# Patient Record
Sex: Female | Born: 1935 | Race: White | Hispanic: No | Smoking: Never smoker
Health system: Southern US, Community
[De-identification: ages and names within clinical notes are randomized; demographics above are authoritative.]

## PROBLEM LIST (undated history)

## (undated) DIAGNOSIS — R1312 Dysphagia, oropharyngeal phase: Secondary | ICD-10-CM

## (undated) DIAGNOSIS — E785 Hyperlipidemia, unspecified: Secondary | ICD-10-CM

## (undated) DIAGNOSIS — N189 Chronic kidney disease, unspecified: Secondary | ICD-10-CM

## (undated) DIAGNOSIS — E039 Hypothyroidism, unspecified: Secondary | ICD-10-CM

## (undated) DIAGNOSIS — C801 Malignant (primary) neoplasm, unspecified: Secondary | ICD-10-CM

## (undated) DIAGNOSIS — K580 Irritable bowel syndrome with diarrhea: Secondary | ICD-10-CM

## (undated) DIAGNOSIS — M810 Age-related osteoporosis without current pathological fracture: Secondary | ICD-10-CM

## (undated) DIAGNOSIS — B029 Zoster without complications: Secondary | ICD-10-CM

## (undated) DIAGNOSIS — E079 Disorder of thyroid, unspecified: Secondary | ICD-10-CM

## (undated) DIAGNOSIS — M1990 Unspecified osteoarthritis, unspecified site: Secondary | ICD-10-CM

## (undated) DIAGNOSIS — N3281 Overactive bladder: Secondary | ICD-10-CM

## (undated) DIAGNOSIS — N182 Chronic kidney disease, stage 2 (mild): Secondary | ICD-10-CM

## (undated) HISTORY — DX: Irritable bowel syndrome with diarrhea: K58.0

## (undated) HISTORY — DX: Hypothyroidism, unspecified: E03.9

## (undated) HISTORY — DX: Age-related osteoporosis without current pathological fracture: M81.0

## (undated) HISTORY — DX: Hyperlipidemia, unspecified: E78.5

## (undated) HISTORY — DX: Overactive bladder: N32.81

## (undated) HISTORY — DX: Unspecified osteoarthritis, unspecified site: M19.90

## (undated) HISTORY — DX: Chronic kidney disease, stage 2 (mild): N18.2

## (undated) HISTORY — DX: Chronic kidney disease, unspecified: N18.9

## (undated) HISTORY — PX: FRACTURE SURGERY: SHX138

## (undated) HISTORY — DX: Malignant (primary) neoplasm, unspecified: C80.1

## (undated) HISTORY — DX: Dysphagia, oropharyngeal phase: R13.12

---

## 1966-02-22 HISTORY — PX: ABDOMINAL HYSTERECTOMY: SHX81

## 1988-02-23 DIAGNOSIS — B029 Zoster without complications: Secondary | ICD-10-CM

## 1988-02-23 HISTORY — DX: Zoster without complications: B02.9

## 1996-10-11 DIAGNOSIS — C4491 Basal cell carcinoma of skin, unspecified: Secondary | ICD-10-CM

## 1996-10-11 HISTORY — DX: Basal cell carcinoma of skin, unspecified: C44.91

## 1998-11-25 ENCOUNTER — Other Ambulatory Visit: Admission: RE | Admit: 1998-11-25 | Discharge: 1998-11-25 | Payer: Self-pay | Admitting: Obstetrics and Gynecology

## 1998-12-18 ENCOUNTER — Encounter: Admission: RE | Admit: 1998-12-18 | Discharge: 1998-12-18 | Payer: Self-pay | Admitting: Obstetrics and Gynecology

## 1998-12-18 ENCOUNTER — Encounter: Payer: Self-pay | Admitting: Obstetrics and Gynecology

## 2000-02-10 ENCOUNTER — Encounter: Admission: RE | Admit: 2000-02-10 | Discharge: 2000-02-10 | Payer: Self-pay | Admitting: Obstetrics and Gynecology

## 2000-02-10 ENCOUNTER — Encounter: Payer: Self-pay | Admitting: Obstetrics and Gynecology

## 2001-01-24 ENCOUNTER — Other Ambulatory Visit: Admission: RE | Admit: 2001-01-24 | Discharge: 2001-01-24 | Payer: Self-pay | Admitting: Obstetrics and Gynecology

## 2001-02-13 ENCOUNTER — Encounter: Admission: RE | Admit: 2001-02-13 | Discharge: 2001-02-13 | Payer: Self-pay | Admitting: Obstetrics and Gynecology

## 2001-02-13 ENCOUNTER — Encounter: Payer: Self-pay | Admitting: Obstetrics and Gynecology

## 2002-06-22 ENCOUNTER — Encounter: Payer: Self-pay | Admitting: Obstetrics and Gynecology

## 2002-06-22 ENCOUNTER — Encounter: Admission: RE | Admit: 2002-06-22 | Discharge: 2002-06-22 | Payer: Self-pay | Admitting: Obstetrics and Gynecology

## 2003-07-01 ENCOUNTER — Other Ambulatory Visit: Admission: RE | Admit: 2003-07-01 | Discharge: 2003-07-01 | Payer: Self-pay | Admitting: Obstetrics and Gynecology

## 2003-09-26 ENCOUNTER — Encounter: Admission: RE | Admit: 2003-09-26 | Discharge: 2003-09-26 | Payer: Self-pay | Admitting: Obstetrics and Gynecology

## 2004-07-30 ENCOUNTER — Ambulatory Visit: Payer: Self-pay | Admitting: *Deleted

## 2004-09-04 ENCOUNTER — Ambulatory Visit (HOSPITAL_COMMUNITY): Admission: RE | Admit: 2004-09-04 | Discharge: 2004-09-04 | Payer: Self-pay | Admitting: *Deleted

## 2004-09-04 ENCOUNTER — Ambulatory Visit: Payer: Self-pay | Admitting: Cardiology

## 2004-09-22 ENCOUNTER — Ambulatory Visit: Payer: Self-pay | Admitting: *Deleted

## 2004-09-28 ENCOUNTER — Ambulatory Visit (HOSPITAL_COMMUNITY): Admission: RE | Admit: 2004-09-28 | Discharge: 2004-09-28 | Payer: Self-pay | Admitting: *Deleted

## 2004-10-02 ENCOUNTER — Inpatient Hospital Stay (HOSPITAL_BASED_OUTPATIENT_CLINIC_OR_DEPARTMENT_OTHER): Admission: RE | Admit: 2004-10-02 | Discharge: 2004-10-02 | Payer: Self-pay | Admitting: Cardiovascular Disease

## 2004-10-02 ENCOUNTER — Ambulatory Visit: Payer: Self-pay | Admitting: Cardiology

## 2004-10-16 ENCOUNTER — Ambulatory Visit: Payer: Self-pay | Admitting: *Deleted

## 2004-11-25 ENCOUNTER — Encounter: Admission: RE | Admit: 2004-11-25 | Discharge: 2004-11-25 | Payer: Self-pay | Admitting: Obstetrics and Gynecology

## 2005-02-26 ENCOUNTER — Emergency Department (HOSPITAL_COMMUNITY): Admission: EM | Admit: 2005-02-26 | Discharge: 2005-02-26 | Payer: Self-pay | Admitting: Emergency Medicine

## 2006-01-19 ENCOUNTER — Encounter: Admission: RE | Admit: 2006-01-19 | Discharge: 2006-01-19 | Payer: Self-pay | Admitting: Obstetrics and Gynecology

## 2007-02-21 ENCOUNTER — Encounter: Admission: RE | Admit: 2007-02-21 | Discharge: 2007-02-21 | Payer: Self-pay | Admitting: Obstetrics and Gynecology

## 2007-04-11 ENCOUNTER — Emergency Department (HOSPITAL_COMMUNITY): Admission: EM | Admit: 2007-04-11 | Discharge: 2007-04-11 | Payer: Self-pay | Admitting: Emergency Medicine

## 2007-08-02 DIAGNOSIS — C4492 Squamous cell carcinoma of skin, unspecified: Secondary | ICD-10-CM

## 2007-08-02 HISTORY — DX: Squamous cell carcinoma of skin, unspecified: C44.92

## 2007-08-23 ENCOUNTER — Emergency Department (HOSPITAL_COMMUNITY): Admission: EM | Admit: 2007-08-23 | Discharge: 2007-08-24 | Payer: Self-pay | Admitting: Emergency Medicine

## 2008-02-23 LAB — HM COLONOSCOPY

## 2008-03-05 ENCOUNTER — Encounter: Admission: RE | Admit: 2008-03-05 | Discharge: 2008-03-05 | Payer: Self-pay | Admitting: Obstetrics and Gynecology

## 2008-08-06 ENCOUNTER — Encounter: Admission: RE | Admit: 2008-08-06 | Discharge: 2008-08-06 | Payer: Self-pay | Admitting: Obstetrics and Gynecology

## 2009-02-22 HISTORY — PX: ANKLE SURGERY: SHX546

## 2009-03-18 ENCOUNTER — Encounter: Admission: RE | Admit: 2009-03-18 | Discharge: 2009-03-18 | Payer: Self-pay | Admitting: Obstetrics and Gynecology

## 2009-12-19 ENCOUNTER — Ambulatory Visit: Payer: Self-pay | Admitting: Orthopedic Surgery

## 2009-12-19 ENCOUNTER — Inpatient Hospital Stay (HOSPITAL_COMMUNITY): Admission: EM | Admit: 2009-12-19 | Discharge: 2009-12-24 | Payer: Self-pay | Admitting: Emergency Medicine

## 2009-12-24 ENCOUNTER — Inpatient Hospital Stay
Admission: AD | Admit: 2009-12-24 | Discharge: 2010-02-20 | Payer: Self-pay | Source: Home / Self Care | Attending: Pulmonary Disease | Admitting: Pulmonary Disease

## 2009-12-31 ENCOUNTER — Ambulatory Visit (HOSPITAL_COMMUNITY): Admission: RE | Admit: 2009-12-31 | Discharge: 2009-12-31 | Payer: Self-pay | Admitting: Orthopedic Surgery

## 2009-12-31 ENCOUNTER — Ambulatory Visit: Payer: Self-pay | Admitting: Orthopedic Surgery

## 2009-12-31 DIAGNOSIS — S82843A Displaced bimalleolar fracture of unspecified lower leg, initial encounter for closed fracture: Secondary | ICD-10-CM

## 2010-01-06 ENCOUNTER — Ambulatory Visit: Payer: Self-pay | Admitting: Orthopedic Surgery

## 2010-01-20 ENCOUNTER — Ambulatory Visit: Payer: Self-pay | Admitting: Orthopedic Surgery

## 2010-02-03 ENCOUNTER — Ambulatory Visit: Payer: Self-pay | Admitting: Orthopedic Surgery

## 2010-02-04 ENCOUNTER — Telehealth: Payer: Self-pay | Admitting: Orthopedic Surgery

## 2010-03-03 ENCOUNTER — Ambulatory Visit
Admission: RE | Admit: 2010-03-03 | Discharge: 2010-03-03 | Payer: Self-pay | Source: Home / Self Care | Attending: Orthopedic Surgery | Admitting: Orthopedic Surgery

## 2010-03-05 ENCOUNTER — Encounter: Payer: Self-pay | Admitting: Orthopedic Surgery

## 2010-03-15 ENCOUNTER — Encounter: Payer: Self-pay | Admitting: *Deleted

## 2010-03-26 NOTE — Assessment & Plan Note (Signed)
Summary: 2 WK RE-CK/XRAY OOP/POST OP RT ANKLE FX/MEDICARE/CAF   Visit Type:  post op  CC:  right ankle fracture.  History of Present Illness: 77 year old female for a postop visit  date of surgery October 28  Procedure open treatment internal fixation RIGHT ankle including fixation of large postero-malleolus fragment  Current medication Ultram one tablet at night  Complaints currently not having significant pain of any kind  Scheduled for x-rays out of plaster today.  Note we did have significant issues with her skin as it was very tenuous and friable  her skin is improved significantly she does have an area of approximately 1-1/2 cm where the skin edges have separated there is no depth to this area and otherwise everything else looks fine  Her foot is in neutral position  Radiographs were obtained 3 views of the RIGHT ankle plate fixation and anterior posterior screw fixation is noted fixing the fracture which is in excellent position it is healing well with proper alignment of the mortise  Impression stable ankle fixation.  Patient is placed in a cam walking boot and she can start weightbearing with 15 pounds of pressure scheduled for followup visit for x-rays again.  Dressing changes will be done daily.    Impression & Recommendations:  Problem # 1:  AFTERCARE FOLLOW SURGERY MUSCULOSKEL SYSTEM NEC (ICD-V58.78) Assessment Comment Only  Orders: Post-Op Check (10932) Ankle x-ray complete,  minimum 3 views (35573)  Problem # 2:  CLOSED BIMALLEOLAR FRACTURE (ICD-824.4) Assessment: Comment Only  Orders: Post-Op Check (22025) Ankle x-ray complete,  minimum 3 views (42706)  Patient Instructions: 1)  xrays in 4 weeks    Orders Added: 1)  Post-Op Check [99024] 2)  Ankle x-ray complete,  minimum 3 views [23762]

## 2010-03-26 NOTE — Progress Notes (Signed)
Summary: call from [facility] about WB  Phone Note Other Incoming   Summary of Call: Mavis from [facility] called about instructions per visit 02/03/10 re: 15 lb weight-bearing. Asking if this means "toe-touching."  Please advise. Ph [phone] Initial call taken by: Cammie Sickle,  February 04, 2010 4:21 PM  Follow-up for Phone Call        put her foot on a scale so she can see what 15 lbs feels like [the therapist should be familiar with this  Follow-up by: Fuller Canada MD,  February 04, 2010 4:52 PM  Additional Follow-up for Phone Call Additional follow up Details #1::        [facility], spoke w/patient's nurse Magdalene River, advised per above. Additional Follow-up by: Cammie Sickle,  February 04, 2010 5:47 PM

## 2010-03-26 NOTE — Assessment & Plan Note (Signed)
Summary: 1 WK RE-CK WOUND+RE-ASSESS;?CAST/POST OP RT ANKLE FX/MEDICARE...   Visit Type:  Follow-up  CC:  post op ankle.  History of Present Illness: 77 year old female, status post open treatment internal fixation of the RIGHT ankle.  This is postop day 17.  Date of surgery December 20, 2009 open treatment internal fixation, RIGHT ankle with lateral malleolus fracture, and large posterior malleolar fragment, requiring fixation.  She's had significant skin issues, and we brought her back in for skin check and remove the remaining staples. Possible application of cast.  Current medications are Norco 5 mg Tylenol for pain and Lovenox for the balance of 30 days.  She also has a UTI. Right now is on ciprofloxacin and Pyridium.  Has a UTI now on Cipro and Pyridium.  she says him doing probably I can't sleep.          Physical Exam  Additional Exam:  her skin continues to be tenuous. We removed the remaining staples. The incision is remaining closed and intact. She still has a lot of subcutaneous bleeding.  She does have a neutral foot position and she is placed back in a short-leg, nonweightbearing cast   Impression & Recommendations:  Problem # 1:  AFTERCARE FOLLOW SURGERY MUSCULOSKEL SYSTEM NEC (ICD-V58.78)  Orders: Post-Op Check (11914)  Problem # 2:  CLOSED BIMALLEOLAR FRACTURE (ICD-824.4)  Orders: Post-Op Check (78295)  Patient Instructions: 1)  Please schedule a follow-up appointment in 2 weeks. 2)  xrays in the cast    Orders Added: 1)  Post-Op Check [62130]

## 2010-03-26 NOTE — Miscellaneous (Signed)
Summary: Nursing Home order  Nursing Home order   Imported By: Cammie Sickle 01/21/2010 11:16:31  _____________________________________________________________________  External Attachment:    Type:   Image     Comment:   External Document

## 2010-03-26 NOTE — Assessment & Plan Note (Signed)
Summary: RE-CK/XRAYS IN CAST/RT ANKLE FX POST OP/MEDICARE/CAF   Visit Type:  Follow-up  CC:  post op ankle.  History of Present Illness: 77 year old female, status post open treatment internal fixation of the RIGHT ankle.  This is postop   4 weeks and 4 days  Date of surgery December 19, 2009 open treatment internal fixation, RIGHT ankle with lateral malleolus fracture, and large posterior malleolar fragment, requiring fixation.  Current medications are Norco 5 mg made her have bad dreams at night and Lovenox stopped 01/18/10.  Today is 2 week recheck with xrays in cast.  No cast problems.  Benadryl made her wired, did not help her sleep.  Dr. Juanetta Gosling gave her Ambien and this helps, he also changed her pain med to Ultram.                Impression & Recommendations:  Problem # 1:  AFTERCARE FOLLOW SURGERY MUSCULOSKEL SYSTEM NEC (ICD-V58.78) Assessment Comment Only  3 views of the RIGHT ankle. There is internal fixation with an anteroposterior screw for a large postero lateral fragment, which is well reduced. Articulations look normal. Fracture appears to be healing.  There is one staple at the top of the incision, which were removed, and she comes back  Orders: Post-Op Check (19147) Ankle x-ray complete,  minimum 3 views (82956)  Problem # 2:  CLOSED BIMALLEOLAR FRACTURE (ICD-824.4) Assessment: Comment Only  Orders: Post-Op Check (21308) Ankle x-ray complete,  minimum 3 views (65784)  Patient Instructions: 1)  Please schedule a follow-up appointment in 2 weeks. 2)  xray out of plaster    Orders Added: 1)  Post-Op Check [99024] 2)  Ankle x-ray complete,  minimum 3 views [73610]

## 2010-03-26 NOTE — Miscellaneous (Signed)
Summary: Nursing Home Order  Nursing Home Order   Imported By: Cammie Sickle 02/05/2010 11:30:22  _____________________________________________________________________  External Attachment:    Type:   Image     Comment:   External Document

## 2010-03-26 NOTE — Assessment & Plan Note (Signed)
Summary: 4 WK RE-CK/ XRAY/POST OP RT ANKLE FX/MEDICARE/[facility]   Visit Type:  Follow-up  CC:  recheck right ankle.  History of Present Illness: 77 year old female for a postop visit  date of surgery October 28, postop week #10  Procedure open treatment internal fixation RIGHT ankle including fixation of large postero-malleolus fragment  No pain meds needed.  Today is 4 week recheck with xrays after wearing Cam walker with 15 lbs of wtbearing.    Her wounds have been significantly. She is doing well with her walking boot. She's been compliant. [facility] and physical therapy, which I would like to continue.  Her wound care can be stopped as her legs have healed nicely, but she needs continued therapy.     Impression & Recommendations:  Problem # 1:  AFTERCARE FOLLOW SURGERY MUSCULOSKEL SYSTEM NEC (ICD-V58.78)  Separate and Identifiable X-Ray report      AP lateral, and mortise view, RIGHT ankle.  There is noted. Mediolateral fixation with an intact mortise and healed fibula fracture, as well as medial malleolar fracture; also seen is an anteroposterior screw for a large postero-lateral fragment  Impression healed fracture, RIGHT ankle with internal fixation  Orders: Home Health Referral (Home Health) Post-Op Check [phone]) Ankle x-ray complete,  minimum 3 views (60454)  Problem # 2:  CLOSED BIMALLEOLAR FRACTURE (ICD-824.4)  Orders: Home Health Referral (Home Health) Post-Op Check [id number]) Ankle x-ray complete,  minimum 3 views (91478)  Patient Instructions: 1)  wbat in brace  2)  xrays in 4 weeks    Orders Added: 1)  Home Health Referral [facility]] 2)  Post-Op Check [99024] 3)  Ankle x-ray complete,  minimum 3 views [29562]

## 2010-03-26 NOTE — Miscellaneous (Signed)
Summary: Nursing Home visit  Nursing Home visit   Imported By: Cammie Sickle 01/01/2010 12:02:44  _____________________________________________________________________  External Attachment:    Type:   Image     Comment:   External Document

## 2010-03-26 NOTE — Assessment & Plan Note (Signed)
Summary: hosp f/u rt ankle fx getting xr prior to app't/bsf   Visit Type:  Follow-up from hospital  CC:  post op.  History of Present Illness: I saw Sharon Scott in the office today for a followup visit.  She is a 77 years old woman with the complaint of:  post op right ankle.  DOS 12/20/09 OTIF right ankle. [lateral malleolus fracture with large posterior mall frag, req. fixation].  POD 11.  Today in for dressing change, xrays taken [facility] today 12/31/09.  No pain right now.  MEDS: Norco 5 and Tylenol for pain, lovenox       Physical Exam  Skin:  anterior incision looks great / staples taken out   lateral incision: several staples were removed, and several were LEFT in. The lateral incision as noted in the surgical notes was difficult to close.  She also has lateral skin tears, and anterolateral skin tears, which were treated with Steri-Strips, which healed nicely.  She has a lot of subcutaneous ecchymosis, edema, and bleeding, which is resolving.     Impression & Recommendations:  Problem # 1:  CLOSED BIMALLEOLAR FRACTURE (ICD-824.4) Assessment Comment Only  radiographs were done at the hospital and are for review. The fracture fixation seems to be intact. The distal screw commented on as being along his long and was LEFT long because order screws did not give good fixation.  Fluoroscopic views were taken to assure there was no impingement on the bone.  Patient placed in a padded posterior splint with the foot in neutral position.  Orders: Post-Op Check (16109)  Problem # 2:  AFTERCARE FOLLOW SURGERY MUSCULOSKEL SYSTEM NEC (ICD-V58.78) Assessment: Comment Only  Orders: Post-Op Check (60454)  Patient Instructions: 1)  Please schedule a follow-up appointment in 1 week. 2)  wound check cast ?   Orders Added: 1)  Post-Op Check [09811]

## 2010-03-26 NOTE — Miscellaneous (Signed)
Summary: Nursing Home visit  Nursing Home visit   Imported By: Cammie Sickle 01/16/2010 14:06:53  _____________________________________________________________________  External Attachment:    Type:   Image     Comment:   External Document

## 2010-03-26 NOTE — Miscellaneous (Signed)
Summary: Home care orders  Home care orders   Imported By: Jacklynn Ganong 03/20/2010 12:33:45  _____________________________________________________________________  External Attachment:    Type:   Image     Comment:   External Document

## 2010-04-07 ENCOUNTER — Telehealth: Payer: Self-pay | Admitting: Orthopedic Surgery

## 2010-04-07 ENCOUNTER — Encounter: Payer: Self-pay | Admitting: Orthopedic Surgery

## 2010-04-07 ENCOUNTER — Ambulatory Visit (INDEPENDENT_AMBULATORY_CARE_PROVIDER_SITE_OTHER): Payer: Medicare Other | Admitting: Orthopedic Surgery

## 2010-04-07 DIAGNOSIS — S82843A Displaced bimalleolar fracture of unspecified lower leg, initial encounter for closed fracture: Secondary | ICD-10-CM

## 2010-04-07 DIAGNOSIS — Z4789 Encounter for other orthopedic aftercare: Secondary | ICD-10-CM

## 2010-04-08 ENCOUNTER — Telehealth: Payer: Self-pay | Admitting: Orthopedic Surgery

## 2010-04-14 ENCOUNTER — Telehealth: Payer: Self-pay | Admitting: Orthopedic Surgery

## 2010-04-15 NOTE — Progress Notes (Signed)
Summary: Order for hose Vs compression stockings  ---- Converted from flag ---- ---- 04/08/2010 11:17 AM, Cammie Sickle wrote: I called [facility]; per Tyler Aas, they have Ted hose.  States patient will need a new order and to specify "knee high" or "thigh high"  ---- 04/08/2010 11:17 AM, Chasity L Kandis Ban wrote: she needs ted hose instead of compression stocking  ---- 04/08/2010 11:04 AM, Cammie Sickle wrote: Chasity, forwarding Dr's note, not sure I understand  ---- 04/08/2010 9:51 AM, Fuller Canada MD wrote: ask them if she can have ted hose  ---- 04/08/2010 9:48 AM, Cammie Sickle wrote: Note was signed off re: question & ph note 04/07/10 - [facility] evidently cannot assist Worthy Rancher. Ph note was signed - what do we advise?  Do we need new RX if patient is to need to go to [facility]? ------------------------------       New/Updated Medications: * TED HOSE KNEE HIGH wear on legs for swelling Prescriptions: TED HOSE KNEE HIGH wear on legs for swelling  #2 x 0   Entered by:   Ether Griffins   Authorized by:   Fuller Canada MD   Signed by:   Ether Griffins on 04/08/2010   Method used:   Faxed to ...       [facility]* (retail)       [address]       [place], [place]  [address]       Ph: [phone]       Fax: [phone]   RxID:   [id number]

## 2010-04-15 NOTE — Miscellaneous (Signed)
Summary: Home Care PT progress note  Home Care PT progress note   Imported By: Jacklynn Ganong 04/07/2010 15:43:53  _____________________________________________________________________  External Attachment:    Type:   Image     Comment:   External Document

## 2010-04-15 NOTE — Assessment & Plan Note (Signed)
Summary: 4 wk reck xr rt ankle/post op fx/mcr/[facility]/caf   Visit Type:  Follow-up  CC:  right ankle fracture.  History of Present Illness: I saw Sharon Scott in the office today for a 1 month followup visit.  She is a 77 years old woman with the complaint of:  right ankle fracture.  Xrays today.  date of surgery October 28, postop  Procedure open treatment internal fixation RIGHT ankle including fixation of large postero-malleolus fragment  No pain meds needed.  Complaints: Soreness.  Physical Exam  Additional Exam:  right leg edema and skin ven stasis disease   ankle ROM is 17/21 df/pf   stable ankle joint   tender anterior portion of leg   pulses are normal   motor function is normal    Impression & Recommendations:  Problem # 1:  AFTERCARE FOLLOW SURGERY MUSCULOSKEL SYSTEM NEC (ICD-V58.78) Assessment Improved  Orders: Est. Patient Level III (04540) Ankle x-ray complete,  minimum 3 views (98119)  Problem # 2:  CLOSED BIMALLEOLAR FRACTURE (ICD-824.4) Assessment: Improved  Separate and Identifiable X-Ray report      Mortise, AP and Lateral right ankle   latearl plate and anterior screw in place fracture has healed   IMPR: healed fracture ankle with internal fixation   Orders: Est. Patient Level III (14782) Ankle x-ray complete,  minimum 3 views (95621)  Patient Instructions: 1)  Please schedule a follow-up appointment in 6 weeks  2)  check ankle , gait etc    Orders Added: 1)  Est. Patient Level III [30865] 2)  Ankle x-ray complete,  minimum 3 views [78469]

## 2010-04-15 NOTE — Progress Notes (Signed)
Summary: patient came back about [facility] RX questions  Phone Note Call from Patient   Caller: Patient Summary of Call: Patient came back w/ Rx for compression stockings. States [facility] advised that these stockings would be too tight, and that they "do not have any material there that they would risk putting on her."  They gave her names & #'s for:  [facility] and also [facility] Please advise Patient Cell Ph [phone] Initial call taken by: Cammie Sickle,  April 07, 2010 3:24 PM

## 2010-04-15 NOTE — Medication Information (Signed)
Summary: RX Folder [facility] compression stockings  RX Folder [facility] compression stockings   Imported By: Cammie Sickle 04/07/2010 21:01:36  _____________________________________________________________________  External Attachment:    Type:   Image     Comment:   External Document

## 2010-04-17 ENCOUNTER — Encounter: Payer: Self-pay | Admitting: Orthopedic Surgery

## 2010-04-21 NOTE — Progress Notes (Signed)
Summary: extend PT 2 more weeks?  Phone Note Other Incoming   Summary of Call: [facility] called about Sharon Scott ([birth year])  Sharon Scott asked if you will Sharon Scott for an extension of therapy for  2 X a week for 2 more weeks for ankle strengthening and gait training His # [phone] Initial call taken by: Sharon Scott,  April 14, 2010 9:50 AM  Follow-up for Phone Call        yes Follow-up by: Sharon Canada MD,  April 14, 2010 2:10 PM  Additional Follow-up for Phone Call Additional follow up Details #1::        ok lmom for [facility] Additional Follow-up by: Sharon Scott,  April 14, 2010 2:16 PM

## 2010-04-30 NOTE — Miscellaneous (Signed)
Summary: [facility] orders  [facility] orders   Imported By: Jacklynn Ganong 04/21/2010 15:16:11  _____________________________________________________________________  External Attachment:    Type:   Image     Comment:   External Document

## 2010-05-05 ENCOUNTER — Encounter: Payer: Self-pay | Admitting: Orthopedic Surgery

## 2010-05-06 LAB — DIFFERENTIAL
Basophils Relative: 0 % (ref 0–1)
Eosinophils Absolute: 0.1 10*3/uL (ref 0.0–0.7)
Eosinophils Relative: 1 % (ref 0–5)
Monocytes Absolute: 1 10*3/uL (ref 0.1–1.0)
Monocytes Relative: 10 % (ref 3–12)
Neutrophils Relative %: 76 % (ref 43–77)

## 2010-05-06 LAB — CBC
Hemoglobin: 11.8 g/dL — ABNORMAL LOW (ref 12.0–15.0)
MCH: 28.8 pg (ref 26.0–34.0)
MCHC: 33 g/dL (ref 30.0–36.0)

## 2010-05-06 LAB — BASIC METABOLIC PANEL
CO2: 29 mEq/L (ref 19–32)
Calcium: 9 mg/dL (ref 8.4–10.5)
Glucose, Bld: 141 mg/dL — ABNORMAL HIGH (ref 70–99)
Sodium: 139 mEq/L (ref 135–145)

## 2010-05-06 LAB — CARDIAC PANEL(CRET KIN+CKTOT+MB+TROPI): Relative Index: 1.9 (ref 0.0–2.5)

## 2010-05-20 ENCOUNTER — Ambulatory Visit (INDEPENDENT_AMBULATORY_CARE_PROVIDER_SITE_OTHER): Payer: Medicare Other | Admitting: Orthopedic Surgery

## 2010-05-20 DIAGNOSIS — S82843A Displaced bimalleolar fracture of unspecified lower leg, initial encounter for closed fracture: Secondary | ICD-10-CM

## 2010-05-20 NOTE — Patient Instructions (Signed)
Wear brace as needed  

## 2010-05-21 ENCOUNTER — Encounter: Payer: Self-pay | Admitting: Orthopedic Surgery

## 2010-05-21 NOTE — Progress Notes (Signed)
Status post open treatment internal fixation of the RIGHT ankle, palpated by venous stasis disease. Patient is now in an ASO brace ambulating with a cane doing well. Minimal pain at this point. Her ambulation, seems to be improving in terms of distance.  Her skin changes have resolved.  Ankle motion is now 10 of dorsiflexion and 20 of plantarflexion. Her incisions look good. Her plantar flexion. Strength is good. Muscle tone is normal. Sensation is normal. Dorsalis pedis pulses, normal.  Impression resolved ankle fracture, status post internal fixation.  Plan progressive ambulation. Increase activities as tolerated. Follow up as needed

## 2010-06-08 ENCOUNTER — Encounter: Payer: Self-pay | Admitting: Orthopedic Surgery

## 2010-06-11 ENCOUNTER — Other Ambulatory Visit: Payer: Self-pay | Admitting: Obstetrics and Gynecology

## 2010-06-11 DIAGNOSIS — Z1231 Encounter for screening mammogram for malignant neoplasm of breast: Secondary | ICD-10-CM

## 2010-06-16 ENCOUNTER — Ambulatory Visit
Admission: RE | Admit: 2010-06-16 | Discharge: 2010-06-16 | Disposition: A | Discharge status: Home / Self Care | Payer: No Typology Code available for payment source | Source: Ambulatory Visit | Attending: Obstetrics and Gynecology | Admitting: Obstetrics and Gynecology

## 2010-06-16 DIAGNOSIS — Z1231 Encounter for screening mammogram for malignant neoplasm of breast: Secondary | ICD-10-CM

## 2010-07-10 NOTE — Cardiovascular Report (Signed)
Sharon Scott, Sharon Scott                 ACCOUNT NO.:  [id number]   MEDICAL RECORD NO.:  [id number]          PATIENT TYPE:  OIB   LOCATION:  6501                         FACILITY:  [facility]   PHYSICIAN:  Charlies Constable, M.D. [facility] DATE OF BIRTH:  Jun 13, 1924   DATE OF PROCEDURE:  10/02/2004  DATE OF DISCHARGE:  10/02/2004                              CARDIAC CATHETERIZATION   CLINICAL HISTORY:  Ms. Maes is 77 years old.  Has a history of  hyperlipidemia and was recently seen in consultation by Dr. Dorethea Clan for chest  pain and shortness of breath.  She had a stress echocardiogram that was  borderline for a wall motion abnormality in her septal region.  For this  reason she was scheduled for evaluation with angiography.   PROCEDURE:  Left heart catheterization was performed percutaneously via the  right femoral artery using arterial sheath and 4-French preformed coronary  catheters.  A front wall arterial puncture was performed and Omnipaque  contrast was used.  The patient tolerated the procedure well and left the  laboratory in satisfactory condition.   RESULTS:  The aortic pressure was 145/62 with a mean of 98.  Left  ventricular pressure was 145/13.   Left main coronary artery:  Free of significant disease.   Left anterior descending artery:  Gave rise to a diagonal branch and three  septal perforators.  These and the LAD proper were free of significant  disease.   Circumflex artery:  Gave rise to a ramus branch, a marginal branch, and a  posterolateral branch.  These vessels were free of significant disease.  The  right coronary artery is a moderate-sized vessel.  Gave rise to a conus  branch, two right ventricular branches, a posterior descending branch, and  two small posterolateral branches.  These vessels were free of significant  disease.   LEFT VENTRICULOGRAM:  The left ventriculogram performed in the RAO  projection showed good wall motion with no areas of hypokinesis.  The  estimated ejection fraction was 60%.   CONCLUSIONS:  Normal coronary angiography and left ventricular wall motion.   RECOMMENDATIONS:  Reassurance.  Will arrange for the patient to see Dr.  Dorethea Clan back in follow-up and he can decide if any further evaluation of the  patient's shortness of breath and chest pain is indicated.  Study today  indicates it is not likely that these symptoms are cardiac-related.       BB/MEDQ  D:  10/02/2004  T:  10/02/2004  Job:  [id number]   cc:   Vida Roller, M.D.  Fax: [phone]   Sherry A. Rosalio Macadamia, M.D.  [address]  [place]  [place] [address]  Fax: [phone]   [facility]   Oneal Deputy. Juanetta Gosling, M.D.  [address]  [place]  [place] [address]  Fax: [phone]

## 2010-07-10 NOTE — Procedures (Signed)
Sharon Scott, Sharon Scott                 ACCOUNT NO.:  [id number]   MEDICAL RECORD NO.:  [id number]          PATIENT TYPE:  OUT   LOCATION:  RAD                           FACILITY:  [facility]   PHYSICIAN:  Walker Bing, M.D.  DATE OF BIRTH:  [birth year]   DATE OF PROCEDURE:  09/04/2004  DATE OF DISCHARGE:  09/04/2004                                  ECHOCARDIOGRAM   PROCEDURE:  Stress echocardiogram.   CARDIOLOGIST:  P[place]mouth Bing, M.D.   REFERRING PHYSICIAN:  Dr. Juanetta Gosling and Dr. Dorethea Clan.   CLINICAL DATA:  A 77 year old woman with chest pain.   RESULTS:  1.  Treadmill exercise performed to a workload of 4.6 mets and a heart rate      of 140, which is in excess of 100% of age - predicted maximum.  Exercise      discontinued due to dyspnea and fatigue; no chest pain reported.  2.  Blood pressure increased from a resting value of 130/60 to 180/80 at      peak exercise, a normal response.  3.  Frequent PACs present; there were short runs of nonsustained      supraventricular tachycardia early in recovery period.  4.  EKG: Suboptimal quality; sinus rhythm; delayed R wave progression;      nonspecific ST-segment abnormality.  5.  Stress EKG:  Interpretation impaired by further deterioration in tracing      quality. A 1.0 to 1.5 mm of slowly upsloping ST-segment depression      present in the inferolateral leads, reverted rapidly towards normal in      recovery.  6.  Resting echocardiogram:  Mild to moderate left ventricular hypertrophy;      normal chamber dimensions; normal regional and global function.  7.  Post - exercise echocardiogram: There was no increase in contractility      in the anteroseptal region; other segments showed appropriate      hyperdynamic function.   IMPRESSION:  Borderline abnormal stress echocardiogram revealing  insignificant stress - induced EKG abnormalities, impaired exercise  capacity, a rapid increase in heart rate of low level exercise consistent  with  physical deconditioning and soft but definite echocardiographic  abnormalities that may indicate anteroseptal ischemia.  Her other findings  as noted.       RR/MEDQ  D:  09/06/2004  T:  09/07/2004  Job:  [id number]

## 2010-08-12 ENCOUNTER — Other Ambulatory Visit: Payer: Self-pay | Admitting: Obstetrics and Gynecology

## 2010-08-12 DIAGNOSIS — M8580 Other specified disorders of bone density and structure, unspecified site: Secondary | ICD-10-CM

## 2010-08-27 ENCOUNTER — Other Ambulatory Visit: Payer: No Typology Code available for payment source

## 2010-09-01 ENCOUNTER — Ambulatory Visit
Admission: RE | Admit: 2010-09-01 | Discharge: 2010-09-01 | Disposition: A | Discharge status: Home / Self Care | Payer: No Typology Code available for payment source | Source: Ambulatory Visit | Attending: Obstetrics and Gynecology | Admitting: Obstetrics and Gynecology

## 2010-09-01 ENCOUNTER — Other Ambulatory Visit: Payer: No Typology Code available for payment source

## 2010-09-01 DIAGNOSIS — M8580 Other specified disorders of bone density and structure, unspecified site: Secondary | ICD-10-CM

## 2011-02-26 DIAGNOSIS — E039 Hypothyroidism, unspecified: Secondary | ICD-10-CM | POA: Diagnosis not present

## 2011-02-26 DIAGNOSIS — E785 Hyperlipidemia, unspecified: Secondary | ICD-10-CM | POA: Diagnosis not present

## 2011-03-02 DIAGNOSIS — E039 Hypothyroidism, unspecified: Secondary | ICD-10-CM | POA: Diagnosis not present

## 2011-03-02 DIAGNOSIS — E876 Hypokalemia: Secondary | ICD-10-CM | POA: Diagnosis not present

## 2011-03-02 DIAGNOSIS — E785 Hyperlipidemia, unspecified: Secondary | ICD-10-CM | POA: Diagnosis not present

## 2011-04-21 DIAGNOSIS — H35379 Puckering of macula, unspecified eye: Secondary | ICD-10-CM | POA: Diagnosis not present

## 2011-04-21 DIAGNOSIS — Z961 Presence of intraocular lens: Secondary | ICD-10-CM | POA: Diagnosis not present

## 2011-04-21 DIAGNOSIS — H01009 Unspecified blepharitis unspecified eye, unspecified eyelid: Secondary | ICD-10-CM | POA: Diagnosis not present

## 2011-04-21 DIAGNOSIS — H43399 Other vitreous opacities, unspecified eye: Secondary | ICD-10-CM | POA: Diagnosis not present

## 2011-04-23 DIAGNOSIS — E039 Hypothyroidism, unspecified: Secondary | ICD-10-CM | POA: Diagnosis not present

## 2011-04-23 DIAGNOSIS — R197 Diarrhea, unspecified: Secondary | ICD-10-CM | POA: Diagnosis not present

## 2011-04-23 DIAGNOSIS — E869 Volume depletion, unspecified: Secondary | ICD-10-CM | POA: Diagnosis not present

## 2011-04-24 DIAGNOSIS — R197 Diarrhea, unspecified: Secondary | ICD-10-CM | POA: Diagnosis not present

## 2011-05-27 DIAGNOSIS — E785 Hyperlipidemia, unspecified: Secondary | ICD-10-CM | POA: Diagnosis not present

## 2011-05-27 DIAGNOSIS — E039 Hypothyroidism, unspecified: Secondary | ICD-10-CM | POA: Diagnosis not present

## 2011-06-01 DIAGNOSIS — E875 Hyperkalemia: Secondary | ICD-10-CM | POA: Diagnosis not present

## 2011-06-01 DIAGNOSIS — E039 Hypothyroidism, unspecified: Secondary | ICD-10-CM | POA: Diagnosis not present

## 2011-06-01 DIAGNOSIS — R197 Diarrhea, unspecified: Secondary | ICD-10-CM | POA: Diagnosis not present

## 2011-06-08 ENCOUNTER — Other Ambulatory Visit: Payer: Self-pay | Admitting: Obstetrics & Gynecology

## 2011-06-08 DIAGNOSIS — Z1231 Encounter for screening mammogram for malignant neoplasm of breast: Secondary | ICD-10-CM

## 2011-06-29 ENCOUNTER — Ambulatory Visit
Admission: RE | Admit: 2011-06-29 | Discharge: 2011-06-29 | Disposition: A | Discharge status: Home / Self Care | Payer: No Typology Code available for payment source | Source: Ambulatory Visit | Attending: Obstetrics & Gynecology | Admitting: Obstetrics & Gynecology

## 2011-06-29 DIAGNOSIS — Z1231 Encounter for screening mammogram for malignant neoplasm of breast: Secondary | ICD-10-CM | POA: Diagnosis not present

## 2011-07-12 DIAGNOSIS — E038 Other specified hypothyroidism: Secondary | ICD-10-CM | POA: Diagnosis not present

## 2011-07-27 DIAGNOSIS — D1801 Hemangioma of skin and subcutaneous tissue: Secondary | ICD-10-CM | POA: Diagnosis not present

## 2011-07-27 DIAGNOSIS — L821 Other seborrheic keratosis: Secondary | ICD-10-CM | POA: Diagnosis not present

## 2011-07-27 DIAGNOSIS — C4491 Basal cell carcinoma of skin, unspecified: Secondary | ICD-10-CM | POA: Diagnosis not present

## 2011-07-27 DIAGNOSIS — C44319 Basal cell carcinoma of skin of other parts of face: Secondary | ICD-10-CM | POA: Diagnosis not present

## 2011-08-17 DIAGNOSIS — M810 Age-related osteoporosis without current pathological fracture: Secondary | ICD-10-CM | POA: Diagnosis not present

## 2011-08-17 DIAGNOSIS — E039 Hypothyroidism, unspecified: Secondary | ICD-10-CM | POA: Diagnosis not present

## 2011-08-17 DIAGNOSIS — Z124 Encounter for screening for malignant neoplasm of cervix: Secondary | ICD-10-CM | POA: Diagnosis not present

## 2011-08-17 DIAGNOSIS — Z01419 Encounter for gynecological examination (general) (routine) without abnormal findings: Secondary | ICD-10-CM | POA: Diagnosis not present

## 2011-08-24 DIAGNOSIS — E039 Hypothyroidism, unspecified: Secondary | ICD-10-CM | POA: Diagnosis not present

## 2011-08-24 DIAGNOSIS — R197 Diarrhea, unspecified: Secondary | ICD-10-CM | POA: Diagnosis not present

## 2011-08-31 DIAGNOSIS — M810 Age-related osteoporosis without current pathological fracture: Secondary | ICD-10-CM | POA: Diagnosis not present

## 2011-08-31 DIAGNOSIS — I10 Essential (primary) hypertension: Secondary | ICD-10-CM | POA: Diagnosis not present

## 2011-08-31 DIAGNOSIS — M1990 Unspecified osteoarthritis, unspecified site: Secondary | ICD-10-CM | POA: Diagnosis not present

## 2011-10-07 DIAGNOSIS — L988 Other specified disorders of the skin and subcutaneous tissue: Secondary | ICD-10-CM | POA: Diagnosis not present

## 2011-10-07 DIAGNOSIS — D485 Neoplasm of uncertain behavior of skin: Secondary | ICD-10-CM | POA: Diagnosis not present

## 2011-10-07 DIAGNOSIS — C44319 Basal cell carcinoma of skin of other parts of face: Secondary | ICD-10-CM | POA: Diagnosis not present

## 2012-01-04 DIAGNOSIS — Z23 Encounter for immunization: Secondary | ICD-10-CM | POA: Diagnosis not present

## 2012-01-04 DIAGNOSIS — N19 Unspecified kidney failure: Secondary | ICD-10-CM | POA: Diagnosis not present

## 2012-01-04 DIAGNOSIS — I10 Essential (primary) hypertension: Secondary | ICD-10-CM | POA: Diagnosis not present

## 2012-01-04 DIAGNOSIS — E039 Hypothyroidism, unspecified: Secondary | ICD-10-CM | POA: Diagnosis not present

## 2012-01-11 DIAGNOSIS — I10 Essential (primary) hypertension: Secondary | ICD-10-CM | POA: Diagnosis not present

## 2012-02-09 DIAGNOSIS — L570 Actinic keratosis: Secondary | ICD-10-CM | POA: Diagnosis not present

## 2012-02-09 DIAGNOSIS — Z85828 Personal history of other malignant neoplasm of skin: Secondary | ICD-10-CM | POA: Diagnosis not present

## 2012-02-09 DIAGNOSIS — L821 Other seborrheic keratosis: Secondary | ICD-10-CM | POA: Diagnosis not present

## 2012-04-27 DIAGNOSIS — Z961 Presence of intraocular lens: Secondary | ICD-10-CM | POA: Diagnosis not present

## 2012-04-27 DIAGNOSIS — H35379 Puckering of macula, unspecified eye: Secondary | ICD-10-CM | POA: Diagnosis not present

## 2012-05-02 DIAGNOSIS — N19 Unspecified kidney failure: Secondary | ICD-10-CM | POA: Diagnosis not present

## 2012-07-11 ENCOUNTER — Other Ambulatory Visit: Payer: Self-pay

## 2012-07-11 DIAGNOSIS — Z1231 Encounter for screening mammogram for malignant neoplasm of breast: Secondary | ICD-10-CM

## 2012-08-15 ENCOUNTER — Ambulatory Visit
Admission: RE | Admit: 2012-08-15 | Discharge: 2012-08-15 | Disposition: A | Discharge status: Home / Self Care | Payer: Medicare Other | Source: Ambulatory Visit

## 2012-08-15 DIAGNOSIS — Z1231 Encounter for screening mammogram for malignant neoplasm of breast: Secondary | ICD-10-CM

## 2012-08-22 DIAGNOSIS — Z124 Encounter for screening for malignant neoplasm of cervix: Secondary | ICD-10-CM | POA: Diagnosis not present

## 2012-08-22 DIAGNOSIS — Z01419 Encounter for gynecological examination (general) (routine) without abnormal findings: Secondary | ICD-10-CM | POA: Diagnosis not present

## 2012-08-29 ENCOUNTER — Encounter: Payer: Self-pay | Admitting: Family Medicine

## 2012-08-29 DIAGNOSIS — E039 Hypothyroidism, unspecified: Secondary | ICD-10-CM | POA: Diagnosis not present

## 2012-08-29 DIAGNOSIS — E785 Hyperlipidemia, unspecified: Secondary | ICD-10-CM | POA: Diagnosis not present

## 2012-08-29 DIAGNOSIS — Z79899 Other long term (current) drug therapy: Secondary | ICD-10-CM | POA: Diagnosis not present

## 2012-08-29 DIAGNOSIS — E875 Hyperkalemia: Secondary | ICD-10-CM | POA: Diagnosis not present

## 2012-09-05 DIAGNOSIS — E785 Hyperlipidemia, unspecified: Secondary | ICD-10-CM | POA: Diagnosis not present

## 2012-09-05 DIAGNOSIS — E039 Hypothyroidism, unspecified: Secondary | ICD-10-CM | POA: Diagnosis not present

## 2012-09-05 DIAGNOSIS — M810 Age-related osteoporosis without current pathological fracture: Secondary | ICD-10-CM | POA: Diagnosis not present

## 2012-09-05 DIAGNOSIS — N19 Unspecified kidney failure: Secondary | ICD-10-CM | POA: Diagnosis not present

## 2012-09-06 ENCOUNTER — Other Ambulatory Visit: Payer: Self-pay | Admitting: Pulmonary Disease

## 2012-09-06 DIAGNOSIS — M810 Age-related osteoporosis without current pathological fracture: Secondary | ICD-10-CM

## 2012-09-11 ENCOUNTER — Other Ambulatory Visit (HOSPITAL_COMMUNITY): Payer: Self-pay | Admitting: Pulmonary Disease

## 2012-09-11 ENCOUNTER — Ambulatory Visit (HOSPITAL_COMMUNITY)
Admission: RE | Admit: 2012-09-11 | Discharge: 2012-09-11 | Disposition: A | Discharge status: Home / Self Care | Payer: Medicare Other | Source: Ambulatory Visit | Attending: Pulmonary Disease | Admitting: Pulmonary Disease

## 2012-09-11 DIAGNOSIS — M79609 Pain in unspecified limb: Secondary | ICD-10-CM | POA: Insufficient documentation

## 2012-09-11 DIAGNOSIS — M25561 Pain in right knee: Secondary | ICD-10-CM

## 2012-09-11 DIAGNOSIS — M169 Osteoarthritis of hip, unspecified: Secondary | ICD-10-CM | POA: Diagnosis not present

## 2012-09-11 DIAGNOSIS — M25469 Effusion, unspecified knee: Secondary | ICD-10-CM | POA: Insufficient documentation

## 2012-09-11 DIAGNOSIS — M25569 Pain in unspecified knee: Secondary | ICD-10-CM | POA: Insufficient documentation

## 2012-09-11 DIAGNOSIS — M1710 Unilateral primary osteoarthritis, unspecified knee: Secondary | ICD-10-CM | POA: Diagnosis not present

## 2012-09-21 ENCOUNTER — Ambulatory Visit
Admission: RE | Admit: 2012-09-21 | Discharge: 2012-09-21 | Disposition: A | Discharge status: Home / Self Care | Payer: Medicare Other | Source: Ambulatory Visit | Attending: Pulmonary Disease | Admitting: Pulmonary Disease

## 2012-09-21 ENCOUNTER — Other Ambulatory Visit: Payer: Medicare Other

## 2012-09-21 DIAGNOSIS — M810 Age-related osteoporosis without current pathological fracture: Secondary | ICD-10-CM | POA: Diagnosis not present

## 2012-09-21 DIAGNOSIS — M949 Disorder of cartilage, unspecified: Secondary | ICD-10-CM | POA: Diagnosis not present

## 2012-09-21 DIAGNOSIS — M899 Disorder of bone, unspecified: Secondary | ICD-10-CM | POA: Diagnosis not present

## 2013-01-09 DIAGNOSIS — M62838 Other muscle spasm: Secondary | ICD-10-CM | POA: Diagnosis not present

## 2013-01-09 DIAGNOSIS — Z23 Encounter for immunization: Secondary | ICD-10-CM | POA: Diagnosis not present

## 2013-01-09 DIAGNOSIS — M1990 Unspecified osteoarthritis, unspecified site: Secondary | ICD-10-CM | POA: Diagnosis not present

## 2013-01-09 DIAGNOSIS — E039 Hypothyroidism, unspecified: Secondary | ICD-10-CM | POA: Diagnosis not present

## 2013-01-09 DIAGNOSIS — M810 Age-related osteoporosis without current pathological fracture: Secondary | ICD-10-CM | POA: Diagnosis not present

## 2013-01-09 LAB — VITAMIN D 25 HYDROXY (VIT D DEFICIENCY, FRACTURES): Vit D, 25-Hydroxy: 58

## 2013-03-06 DIAGNOSIS — D0460 Carcinoma in situ of skin of unspecified upper limb, including shoulder: Secondary | ICD-10-CM | POA: Diagnosis not present

## 2013-05-08 DIAGNOSIS — N19 Unspecified kidney failure: Secondary | ICD-10-CM | POA: Diagnosis not present

## 2013-05-08 DIAGNOSIS — M810 Age-related osteoporosis without current pathological fracture: Secondary | ICD-10-CM | POA: Diagnosis not present

## 2013-05-08 DIAGNOSIS — E039 Hypothyroidism, unspecified: Secondary | ICD-10-CM | POA: Diagnosis not present

## 2013-05-08 DIAGNOSIS — M1990 Unspecified osteoarthritis, unspecified site: Secondary | ICD-10-CM | POA: Diagnosis not present

## 2013-05-31 DIAGNOSIS — M9981 Other biomechanical lesions of cervical region: Secondary | ICD-10-CM | POA: Diagnosis not present

## 2013-05-31 DIAGNOSIS — M4712 Other spondylosis with myelopathy, cervical region: Secondary | ICD-10-CM | POA: Diagnosis not present

## 2013-06-06 DIAGNOSIS — M4712 Other spondylosis with myelopathy, cervical region: Secondary | ICD-10-CM | POA: Diagnosis not present

## 2013-06-06 DIAGNOSIS — M9981 Other biomechanical lesions of cervical region: Secondary | ICD-10-CM | POA: Diagnosis not present

## 2013-06-13 DIAGNOSIS — M4712 Other spondylosis with myelopathy, cervical region: Secondary | ICD-10-CM | POA: Diagnosis not present

## 2013-06-13 DIAGNOSIS — M9981 Other biomechanical lesions of cervical region: Secondary | ICD-10-CM | POA: Diagnosis not present

## 2013-06-18 DIAGNOSIS — M9981 Other biomechanical lesions of cervical region: Secondary | ICD-10-CM | POA: Diagnosis not present

## 2013-06-18 DIAGNOSIS — M4712 Other spondylosis with myelopathy, cervical region: Secondary | ICD-10-CM | POA: Diagnosis not present

## 2013-06-21 DIAGNOSIS — M9981 Other biomechanical lesions of cervical region: Secondary | ICD-10-CM | POA: Diagnosis not present

## 2013-06-21 DIAGNOSIS — M4712 Other spondylosis with myelopathy, cervical region: Secondary | ICD-10-CM | POA: Diagnosis not present

## 2013-06-26 DIAGNOSIS — M9981 Other biomechanical lesions of cervical region: Secondary | ICD-10-CM | POA: Diagnosis not present

## 2013-06-26 DIAGNOSIS — M4712 Other spondylosis with myelopathy, cervical region: Secondary | ICD-10-CM | POA: Diagnosis not present

## 2013-06-28 DIAGNOSIS — M4712 Other spondylosis with myelopathy, cervical region: Secondary | ICD-10-CM | POA: Diagnosis not present

## 2013-06-28 DIAGNOSIS — M9981 Other biomechanical lesions of cervical region: Secondary | ICD-10-CM | POA: Diagnosis not present

## 2013-07-03 DIAGNOSIS — L570 Actinic keratosis: Secondary | ICD-10-CM | POA: Diagnosis not present

## 2013-07-03 DIAGNOSIS — M4712 Other spondylosis with myelopathy, cervical region: Secondary | ICD-10-CM | POA: Diagnosis not present

## 2013-07-03 DIAGNOSIS — M9981 Other biomechanical lesions of cervical region: Secondary | ICD-10-CM | POA: Diagnosis not present

## 2013-07-06 DIAGNOSIS — M4712 Other spondylosis with myelopathy, cervical region: Secondary | ICD-10-CM | POA: Diagnosis not present

## 2013-07-06 DIAGNOSIS — M9981 Other biomechanical lesions of cervical region: Secondary | ICD-10-CM | POA: Diagnosis not present

## 2013-07-09 DIAGNOSIS — M9981 Other biomechanical lesions of cervical region: Secondary | ICD-10-CM | POA: Diagnosis not present

## 2013-07-09 DIAGNOSIS — M4712 Other spondylosis with myelopathy, cervical region: Secondary | ICD-10-CM | POA: Diagnosis not present

## 2013-07-12 DIAGNOSIS — M9981 Other biomechanical lesions of cervical region: Secondary | ICD-10-CM | POA: Diagnosis not present

## 2013-07-12 DIAGNOSIS — M4712 Other spondylosis with myelopathy, cervical region: Secondary | ICD-10-CM | POA: Diagnosis not present

## 2013-07-25 DIAGNOSIS — M9981 Other biomechanical lesions of cervical region: Secondary | ICD-10-CM | POA: Diagnosis not present

## 2013-07-25 DIAGNOSIS — M4712 Other spondylosis with myelopathy, cervical region: Secondary | ICD-10-CM | POA: Diagnosis not present

## 2013-07-30 DIAGNOSIS — M4712 Other spondylosis with myelopathy, cervical region: Secondary | ICD-10-CM | POA: Diagnosis not present

## 2013-07-30 DIAGNOSIS — M9981 Other biomechanical lesions of cervical region: Secondary | ICD-10-CM | POA: Diagnosis not present

## 2013-08-02 DIAGNOSIS — M4712 Other spondylosis with myelopathy, cervical region: Secondary | ICD-10-CM | POA: Diagnosis not present

## 2013-08-02 DIAGNOSIS — M9981 Other biomechanical lesions of cervical region: Secondary | ICD-10-CM | POA: Diagnosis not present

## 2013-08-30 DIAGNOSIS — M4712 Other spondylosis with myelopathy, cervical region: Secondary | ICD-10-CM | POA: Diagnosis not present

## 2013-08-30 DIAGNOSIS — M9981 Other biomechanical lesions of cervical region: Secondary | ICD-10-CM | POA: Diagnosis not present

## 2013-08-31 ENCOUNTER — Other Ambulatory Visit: Payer: Self-pay

## 2013-08-31 DIAGNOSIS — Z1231 Encounter for screening mammogram for malignant neoplasm of breast: Secondary | ICD-10-CM

## 2013-09-04 DIAGNOSIS — E785 Hyperlipidemia, unspecified: Secondary | ICD-10-CM | POA: Diagnosis not present

## 2013-09-04 DIAGNOSIS — R0602 Shortness of breath: Secondary | ICD-10-CM | POA: Diagnosis not present

## 2013-09-04 DIAGNOSIS — M1990 Unspecified osteoarthritis, unspecified site: Secondary | ICD-10-CM | POA: Diagnosis not present

## 2013-09-04 DIAGNOSIS — E039 Hypothyroidism, unspecified: Secondary | ICD-10-CM | POA: Diagnosis not present

## 2013-09-11 ENCOUNTER — Encounter (INDEPENDENT_AMBULATORY_CARE_PROVIDER_SITE_OTHER): Payer: Self-pay

## 2013-09-11 ENCOUNTER — Ambulatory Visit
Admission: RE | Admit: 2013-09-11 | Discharge: 2013-09-11 | Disposition: A | Discharge status: Home / Self Care | Payer: Medicare Other | Source: Ambulatory Visit

## 2013-09-11 DIAGNOSIS — Z1231 Encounter for screening mammogram for malignant neoplasm of breast: Secondary | ICD-10-CM | POA: Diagnosis not present

## 2013-09-13 DIAGNOSIS — M4712 Other spondylosis with myelopathy, cervical region: Secondary | ICD-10-CM | POA: Diagnosis not present

## 2013-09-13 DIAGNOSIS — M9981 Other biomechanical lesions of cervical region: Secondary | ICD-10-CM | POA: Diagnosis not present

## 2013-10-02 DIAGNOSIS — Z124 Encounter for screening for malignant neoplasm of cervix: Secondary | ICD-10-CM | POA: Diagnosis not present

## 2013-10-02 DIAGNOSIS — Z01419 Encounter for gynecological examination (general) (routine) without abnormal findings: Secondary | ICD-10-CM | POA: Diagnosis not present

## 2013-10-11 DIAGNOSIS — M4712 Other spondylosis with myelopathy, cervical region: Secondary | ICD-10-CM | POA: Diagnosis not present

## 2013-10-11 DIAGNOSIS — M9981 Other biomechanical lesions of cervical region: Secondary | ICD-10-CM | POA: Diagnosis not present

## 2013-10-25 DIAGNOSIS — M9981 Other biomechanical lesions of cervical region: Secondary | ICD-10-CM | POA: Diagnosis not present

## 2013-10-25 DIAGNOSIS — M4712 Other spondylosis with myelopathy, cervical region: Secondary | ICD-10-CM | POA: Diagnosis not present

## 2013-11-03 ENCOUNTER — Encounter (HOSPITAL_COMMUNITY): Payer: Self-pay | Admitting: Emergency Medicine

## 2013-11-03 ENCOUNTER — Emergency Department (INDEPENDENT_AMBULATORY_CARE_PROVIDER_SITE_OTHER)
Admission: EM | Admit: 2013-11-03 | Discharge: 2013-11-03 | Disposition: A | Discharge status: Home / Self Care | Payer: Medicare Other | Source: Home / Self Care | Attending: Family Medicine | Admitting: Family Medicine

## 2013-11-03 DIAGNOSIS — B029 Zoster without complications: Secondary | ICD-10-CM | POA: Diagnosis not present

## 2013-11-03 HISTORY — DX: Disorder of thyroid, unspecified: E07.9

## 2013-11-03 HISTORY — DX: Zoster without complications: B02.9

## 2013-11-03 MED ORDER — VALACYCLOVIR HCL 1 G PO TABS: 1000.0000 mg | ORAL_TABLET | Freq: Three times a day (TID) | ORAL | Status: AC

## 2013-11-03 MED ORDER — TRAMADOL HCL 50 MG PO TABS: 50.0000 mg | ORAL_TABLET | Freq: Four times a day (QID) | ORAL | Status: DC | PRN

## 2013-11-03 NOTE — ED Provider Notes (Signed)
Medical screening examination/treatment/procedure(s) were performed by a resident physician or non-physician practitioner and as the supervising physician I was immediately available for consultation/collaboration.  Linna Darner, MD Family Medicine   Waldemar Dickens, MD 11/03/13 [phone]

## 2013-11-03 NOTE — ED Provider Notes (Signed)
CSN: [id number]     Arrival date & time 11/03/13  1104 History   First MD Initiated Contact with Patient 11/03/13 1145     Chief Complaint  Patient presents with  . Rash   (Consider location/radiation/quality/duration/timing/severity/associated sxs/prior Treatment) HPI Comments: 80 year old female presents complaining of possible shingles outbreak. She previously had a shingles outbreak about 25 years ago. She got the shingles vaccine about 2 years ago. Starting 3 days ago she began to feel some irritation on the medial portion of the left breast. The next day she had a red rash on arms. Today his bones and began to feel like small blisters. The rash was initially burning but now has become more painful. She denies any rash elsewhere. She denies any systemic symptoms at this time.  Patient is a 80 y.o. female presenting with rash.  Rash   Past Medical History  Diagnosis Date  . Thyroid disease     hypothyroid  . Shingles 1990   Past Surgical History  Procedure Laterality Date  . Ankle surgery Right 2011    plate and 7 screws  . Abdominal hysterectomy  1968   History reviewed. No pertinent family history. History  Substance Use Topics  . Smoking status: Never Smoker   . Smokeless tobacco: Not on file  . Alcohol Use: No   OB History   Grav Para Term Preterm Abortions TAB SAB Ect Mult Living                 Review of Systems  Skin: Positive for rash.  All other systems reviewed and are negative.   Allergies  Clindamycin/lincomycin and Amoxicillin  Home Medications   Prior to Admission medications   Medication Sig Start Date End Date Taking? Authorizing Provider  Alendronate Sodium (FOSAMAX PO) Take 5,600 mg by mouth once a week.   Yes Historical Provider, MD  calcium citrate (CALCITRATE - DOSED IN MG ELEMENTAL CALCIUM) 950 MG tablet Take 600 mg by mouth daily.   Yes Historical Provider, MD  cholecalciferol (VITAMIN D) 1000 UNITS tablet Take 1,000 Units by mouth daily.    Yes Historical Provider, MD  Levothyroxine Sodium 50 MCG CAPS Take by mouth.     Yes Historical Provider, MD  Multiple Vitamins-Minerals (MULTIVITAMIN WITH MINERALS) tablet Take 1 tablet by mouth daily.   Yes Historical Provider, MD  Elastic Bandages & Supports ([facility]) MISC by Does not apply route.      Historical Provider, MD  traMADol (ULTRAM) 50 MG tablet Take 1 tablet (50 mg total) by mouth every 6 (six) hours as needed. 11/03/13   Liam Graham, PA-C  valACYclovir (VALTREX) 1000 MG tablet Take 1 tablet (1,000 mg total) by mouth 3 (three) times daily. 11/03/13 11/17/13  Freeman Caldron Memori Sammon, PA-C   BP 111/80  Pulse 92  Temp(Src) 97.7 F (36.5 C) (Oral)  Resp 12  SpO2 97% Physical Exam  Nursing note and vitals reviewed. Constitutional: She is oriented to person, place, and time. Vital signs are normal. She appears well-developed and well-nourished. No distress.  HENT:  Head: Normocephalic and atraumatic.  Pulmonary/Chest: Effort normal. No respiratory distress.  Neurological: She is alert and oriented to person, place, and time. She has normal strength. Coordination normal.  Skin: Skin is warm and dry. Rash noted. Rash is maculopapular and vesicular. She is not diaphoretic.     Psychiatric: She has a normal mood and affect. Judgment normal.    ED Course  Procedures (including critical care time) Labs  Review Labs Reviewed - No data to display  Imaging Review No results found.   MDM   1. Shingles    Consistent with shingles. Treat with Valtrex, and Ultram when necessary for pain. Followup as needed  New Prescriptions   TRAMADOL (ULTRAM) 50 MG TABLET    Take 1 tablet (50 mg total) by mouth every 6 (six) hours as needed.   VALACYCLOVIR (VALTREX) 1000 MG TABLET    Take 1 tablet (1,000 mg total) by mouth 3 (three) times daily.       Liam Graham, PA-C 11/03/13 1152

## 2013-11-03 NOTE — ED Notes (Signed)
Rash L breast with burning pain onset last Thur. States her bra was rubbing on it and made it irritated.  She applied Neosporin.  Had shingles 25 yrs ago and got a shingles last year or the year before. States she can feel little bubbles on it, but has not drained any fluid.

## 2013-11-03 NOTE — Discharge Instructions (Signed)

## 2013-11-29 DIAGNOSIS — M9901 Segmental and somatic dysfunction of cervical region: Secondary | ICD-10-CM | POA: Diagnosis not present

## 2013-11-29 DIAGNOSIS — M47812 Spondylosis without myelopathy or radiculopathy, cervical region: Secondary | ICD-10-CM | POA: Diagnosis not present

## 2013-12-07 DIAGNOSIS — M1990 Unspecified osteoarthritis, unspecified site: Secondary | ICD-10-CM | POA: Diagnosis not present

## 2013-12-07 DIAGNOSIS — R0602 Shortness of breath: Secondary | ICD-10-CM | POA: Diagnosis not present

## 2013-12-07 DIAGNOSIS — E785 Hyperlipidemia, unspecified: Secondary | ICD-10-CM | POA: Diagnosis not present

## 2013-12-07 DIAGNOSIS — E039 Hypothyroidism, unspecified: Secondary | ICD-10-CM | POA: Diagnosis not present

## 2013-12-11 DIAGNOSIS — M1990 Unspecified osteoarthritis, unspecified site: Secondary | ICD-10-CM | POA: Diagnosis not present

## 2013-12-11 DIAGNOSIS — E039 Hypothyroidism, unspecified: Secondary | ICD-10-CM | POA: Diagnosis not present

## 2013-12-11 DIAGNOSIS — Z23 Encounter for immunization: Secondary | ICD-10-CM | POA: Diagnosis not present

## 2013-12-11 DIAGNOSIS — E785 Hyperlipidemia, unspecified: Secondary | ICD-10-CM | POA: Diagnosis not present

## 2013-12-11 DIAGNOSIS — N189 Chronic kidney disease, unspecified: Secondary | ICD-10-CM | POA: Diagnosis not present

## 2013-12-27 DIAGNOSIS — M9901 Segmental and somatic dysfunction of cervical region: Secondary | ICD-10-CM | POA: Diagnosis not present

## 2013-12-27 DIAGNOSIS — M47812 Spondylosis without myelopathy or radiculopathy, cervical region: Secondary | ICD-10-CM | POA: Diagnosis not present

## 2014-01-24 DIAGNOSIS — M47812 Spondylosis without myelopathy or radiculopathy, cervical region: Secondary | ICD-10-CM | POA: Diagnosis not present

## 2014-01-24 DIAGNOSIS — M9901 Segmental and somatic dysfunction of cervical region: Secondary | ICD-10-CM | POA: Diagnosis not present

## 2014-02-28 DIAGNOSIS — M47812 Spondylosis without myelopathy or radiculopathy, cervical region: Secondary | ICD-10-CM | POA: Diagnosis not present

## 2014-02-28 DIAGNOSIS — M9901 Segmental and somatic dysfunction of cervical region: Secondary | ICD-10-CM | POA: Diagnosis not present

## 2014-03-26 DIAGNOSIS — D0460 Carcinoma in situ of skin of unspecified upper limb, including shoulder: Secondary | ICD-10-CM | POA: Diagnosis not present

## 2014-03-26 DIAGNOSIS — D0461 Carcinoma in situ of skin of right upper limb, including shoulder: Secondary | ICD-10-CM | POA: Diagnosis not present

## 2014-04-11 DIAGNOSIS — M9901 Segmental and somatic dysfunction of cervical region: Secondary | ICD-10-CM | POA: Diagnosis not present

## 2014-04-11 DIAGNOSIS — M47812 Spondylosis without myelopathy or radiculopathy, cervical region: Secondary | ICD-10-CM | POA: Diagnosis not present

## 2014-04-16 DIAGNOSIS — N189 Chronic kidney disease, unspecified: Secondary | ICD-10-CM | POA: Diagnosis not present

## 2014-04-16 DIAGNOSIS — M1990 Unspecified osteoarthritis, unspecified site: Secondary | ICD-10-CM | POA: Diagnosis not present

## 2014-04-16 DIAGNOSIS — M545 Low back pain: Secondary | ICD-10-CM | POA: Diagnosis not present

## 2014-07-11 DIAGNOSIS — B351 Tinea unguium: Secondary | ICD-10-CM | POA: Diagnosis not present

## 2014-07-11 DIAGNOSIS — M79671 Pain in right foot: Secondary | ICD-10-CM | POA: Diagnosis not present

## 2014-07-11 DIAGNOSIS — M79672 Pain in left foot: Secondary | ICD-10-CM | POA: Diagnosis not present

## 2014-07-11 DIAGNOSIS — L609 Nail disorder, unspecified: Secondary | ICD-10-CM | POA: Diagnosis not present

## 2014-07-16 DIAGNOSIS — D485 Neoplasm of uncertain behavior of skin: Secondary | ICD-10-CM | POA: Diagnosis not present

## 2014-07-16 DIAGNOSIS — L570 Actinic keratosis: Secondary | ICD-10-CM | POA: Diagnosis not present

## 2014-07-16 DIAGNOSIS — D2361 Other benign neoplasm of skin of right upper limb, including shoulder: Secondary | ICD-10-CM | POA: Diagnosis not present

## 2014-08-20 DIAGNOSIS — N189 Chronic kidney disease, unspecified: Secondary | ICD-10-CM | POA: Diagnosis not present

## 2014-08-20 DIAGNOSIS — M810 Age-related osteoporosis without current pathological fracture: Secondary | ICD-10-CM | POA: Diagnosis not present

## 2014-08-20 DIAGNOSIS — E039 Hypothyroidism, unspecified: Secondary | ICD-10-CM | POA: Diagnosis not present

## 2014-08-20 DIAGNOSIS — E785 Hyperlipidemia, unspecified: Secondary | ICD-10-CM | POA: Diagnosis not present

## 2014-08-27 DIAGNOSIS — E785 Hyperlipidemia, unspecified: Secondary | ICD-10-CM | POA: Diagnosis not present

## 2014-08-27 DIAGNOSIS — M810 Age-related osteoporosis without current pathological fracture: Secondary | ICD-10-CM | POA: Diagnosis not present

## 2014-08-27 DIAGNOSIS — E039 Hypothyroidism, unspecified: Secondary | ICD-10-CM | POA: Diagnosis not present

## 2014-08-27 DIAGNOSIS — N189 Chronic kidney disease, unspecified: Secondary | ICD-10-CM | POA: Diagnosis not present

## 2014-10-15 DIAGNOSIS — Z01419 Encounter for gynecological examination (general) (routine) without abnormal findings: Secondary | ICD-10-CM | POA: Diagnosis not present

## 2014-10-15 DIAGNOSIS — Z124 Encounter for screening for malignant neoplasm of cervix: Secondary | ICD-10-CM | POA: Diagnosis not present

## 2014-10-21 ENCOUNTER — Other Ambulatory Visit: Payer: Self-pay

## 2014-10-21 ENCOUNTER — Other Ambulatory Visit: Payer: Self-pay | Admitting: Obstetrics & Gynecology

## 2014-10-21 DIAGNOSIS — M810 Age-related osteoporosis without current pathological fracture: Secondary | ICD-10-CM

## 2014-10-21 DIAGNOSIS — Z1231 Encounter for screening mammogram for malignant neoplasm of breast: Secondary | ICD-10-CM

## 2014-11-07 DIAGNOSIS — I739 Peripheral vascular disease, unspecified: Secondary | ICD-10-CM | POA: Diagnosis not present

## 2014-11-07 DIAGNOSIS — B351 Tinea unguium: Secondary | ICD-10-CM | POA: Diagnosis not present

## 2014-12-09 ENCOUNTER — Ambulatory Visit
Admission: RE | Admit: 2014-12-09 | Discharge: 2014-12-09 | Disposition: A | Discharge status: Home / Self Care | Payer: Medicare Other | Source: Ambulatory Visit | Attending: Obstetrics & Gynecology | Admitting: Obstetrics & Gynecology

## 2014-12-09 ENCOUNTER — Ambulatory Visit
Admission: RE | Admit: 2014-12-09 | Discharge: 2014-12-09 | Disposition: A | Discharge status: Home / Self Care | Payer: Medicare Other | Source: Ambulatory Visit

## 2014-12-09 DIAGNOSIS — M810 Age-related osteoporosis without current pathological fracture: Secondary | ICD-10-CM

## 2014-12-09 DIAGNOSIS — Z1231 Encounter for screening mammogram for malignant neoplasm of breast: Secondary | ICD-10-CM

## 2014-12-09 LAB — HM MAMMOGRAPHY

## 2014-12-17 DIAGNOSIS — N189 Chronic kidney disease, unspecified: Secondary | ICD-10-CM | POA: Diagnosis not present

## 2014-12-17 DIAGNOSIS — K580 Irritable bowel syndrome with diarrhea: Secondary | ICD-10-CM | POA: Diagnosis not present

## 2014-12-17 DIAGNOSIS — Z23 Encounter for immunization: Secondary | ICD-10-CM | POA: Diagnosis not present

## 2014-12-17 DIAGNOSIS — M810 Age-related osteoporosis without current pathological fracture: Secondary | ICD-10-CM | POA: Diagnosis not present

## 2014-12-17 DIAGNOSIS — M1990 Unspecified osteoarthritis, unspecified site: Secondary | ICD-10-CM | POA: Diagnosis not present

## 2015-01-09 ENCOUNTER — Ambulatory Visit
Admission: RE | Admit: 2015-01-09 | Discharge: 2015-01-09 | Disposition: A | Discharge status: Home / Self Care | Payer: Medicare Other | Source: Ambulatory Visit | Attending: Obstetrics & Gynecology | Admitting: Obstetrics & Gynecology

## 2015-01-09 DIAGNOSIS — M810 Age-related osteoporosis without current pathological fracture: Secondary | ICD-10-CM | POA: Diagnosis not present

## 2015-01-23 DIAGNOSIS — I739 Peripheral vascular disease, unspecified: Secondary | ICD-10-CM | POA: Diagnosis not present

## 2015-01-23 DIAGNOSIS — B351 Tinea unguium: Secondary | ICD-10-CM | POA: Diagnosis not present

## 2015-04-03 DIAGNOSIS — I739 Peripheral vascular disease, unspecified: Secondary | ICD-10-CM | POA: Diagnosis not present

## 2015-04-03 DIAGNOSIS — B351 Tinea unguium: Secondary | ICD-10-CM | POA: Diagnosis not present

## 2015-04-22 DIAGNOSIS — K589 Irritable bowel syndrome without diarrhea: Secondary | ICD-10-CM | POA: Diagnosis not present

## 2015-04-22 DIAGNOSIS — M545 Low back pain: Secondary | ICD-10-CM | POA: Diagnosis not present

## 2015-04-22 DIAGNOSIS — N181 Chronic kidney disease, stage 1: Secondary | ICD-10-CM | POA: Diagnosis not present

## 2015-04-22 DIAGNOSIS — F419 Anxiety disorder, unspecified: Secondary | ICD-10-CM | POA: Diagnosis not present

## 2015-05-20 DIAGNOSIS — C44319 Basal cell carcinoma of skin of other parts of face: Secondary | ICD-10-CM | POA: Diagnosis not present

## 2015-05-20 DIAGNOSIS — C4491 Basal cell carcinoma of skin, unspecified: Secondary | ICD-10-CM

## 2015-05-20 DIAGNOSIS — D239 Other benign neoplasm of skin, unspecified: Secondary | ICD-10-CM | POA: Diagnosis not present

## 2015-05-20 DIAGNOSIS — C44311 Basal cell carcinoma of skin of nose: Secondary | ICD-10-CM | POA: Diagnosis not present

## 2015-05-20 DIAGNOSIS — L821 Other seborrheic keratosis: Secondary | ICD-10-CM | POA: Diagnosis not present

## 2015-05-20 HISTORY — DX: Basal cell carcinoma of skin, unspecified: C44.91

## 2015-06-19 DIAGNOSIS — I739 Peripheral vascular disease, unspecified: Secondary | ICD-10-CM | POA: Diagnosis not present

## 2015-06-19 DIAGNOSIS — B351 Tinea unguium: Secondary | ICD-10-CM | POA: Diagnosis not present

## 2015-08-04 DIAGNOSIS — C44311 Basal cell carcinoma of skin of nose: Secondary | ICD-10-CM | POA: Diagnosis not present

## 2015-08-15 DIAGNOSIS — M1990 Unspecified osteoarthritis, unspecified site: Secondary | ICD-10-CM | POA: Diagnosis not present

## 2015-08-15 DIAGNOSIS — K589 Irritable bowel syndrome without diarrhea: Secondary | ICD-10-CM | POA: Diagnosis not present

## 2015-08-15 DIAGNOSIS — N189 Chronic kidney disease, unspecified: Secondary | ICD-10-CM | POA: Diagnosis not present

## 2015-08-15 DIAGNOSIS — F419 Anxiety disorder, unspecified: Secondary | ICD-10-CM | POA: Diagnosis not present

## 2015-08-19 DIAGNOSIS — E875 Hyperkalemia: Secondary | ICD-10-CM | POA: Diagnosis not present

## 2015-08-19 DIAGNOSIS — N182 Chronic kidney disease, stage 2 (mild): Secondary | ICD-10-CM | POA: Diagnosis not present

## 2015-08-19 DIAGNOSIS — M1990 Unspecified osteoarthritis, unspecified site: Secondary | ICD-10-CM | POA: Diagnosis not present

## 2015-08-19 DIAGNOSIS — C4490 Unspecified malignant neoplasm of skin, unspecified: Secondary | ICD-10-CM | POA: Diagnosis not present

## 2015-09-04 DIAGNOSIS — B351 Tinea unguium: Secondary | ICD-10-CM | POA: Diagnosis not present

## 2015-09-04 DIAGNOSIS — I739 Peripheral vascular disease, unspecified: Secondary | ICD-10-CM | POA: Diagnosis not present

## 2015-09-18 DIAGNOSIS — E785 Hyperlipidemia, unspecified: Secondary | ICD-10-CM | POA: Diagnosis not present

## 2015-09-18 DIAGNOSIS — M1990 Unspecified osteoarthritis, unspecified site: Secondary | ICD-10-CM | POA: Diagnosis not present

## 2015-09-18 DIAGNOSIS — N182 Chronic kidney disease, stage 2 (mild): Secondary | ICD-10-CM | POA: Diagnosis not present

## 2015-09-18 DIAGNOSIS — C4490 Unspecified malignant neoplasm of skin, unspecified: Secondary | ICD-10-CM | POA: Diagnosis not present

## 2015-10-15 ENCOUNTER — Encounter (HOSPITAL_COMMUNITY): Payer: Self-pay | Admitting: Emergency Medicine

## 2015-10-15 ENCOUNTER — Emergency Department (HOSPITAL_COMMUNITY)
Admission: EM | Admit: 2015-10-15 | Discharge: 2015-10-15 | Disposition: A | Discharge status: Home / Self Care | Payer: Medicare Other | Attending: Emergency Medicine | Admitting: Emergency Medicine

## 2015-10-15 ENCOUNTER — Emergency Department (HOSPITAL_COMMUNITY): Payer: Medicare Other

## 2015-10-15 DIAGNOSIS — R1033 Periumbilical pain: Secondary | ICD-10-CM | POA: Diagnosis not present

## 2015-10-15 DIAGNOSIS — M25552 Pain in left hip: Secondary | ICD-10-CM | POA: Diagnosis present

## 2015-10-15 DIAGNOSIS — N390 Urinary tract infection, site not specified: Secondary | ICD-10-CM | POA: Diagnosis not present

## 2015-10-15 DIAGNOSIS — Z79899 Other long term (current) drug therapy: Secondary | ICD-10-CM | POA: Diagnosis not present

## 2015-10-15 DIAGNOSIS — R102 Pelvic and perineal pain: Secondary | ICD-10-CM | POA: Diagnosis not present

## 2015-10-15 LAB — URINE MICROSCOPIC-ADD ON

## 2015-10-15 LAB — CBC WITH DIFFERENTIAL/PLATELET
BASOS PCT: 0 %
Basophils Absolute: 0 10*3/uL (ref 0.0–0.1)
EOS ABS: 0.2 10*3/uL (ref 0.0–0.7)
Eosinophils Relative: 2 %
HEMATOCRIT: 45.3 % (ref 36.0–46.0)
HEMOGLOBIN: 14.6 g/dL (ref 12.0–15.0)
LYMPHS ABS: 2.1 10*3/uL (ref 0.7–4.0)
Lymphocytes Relative: 24 %
MCH: 29.1 pg (ref 26.0–34.0)
MCHC: 32.2 g/dL (ref 30.0–36.0)
MCV: 90.2 fL (ref 78.0–100.0)
MONO ABS: 0.9 10*3/uL (ref 0.1–1.0)
MONOS PCT: 10 %
NEUTROS PCT: 64 %
Neutro Abs: 5.6 10*3/uL (ref 1.7–7.7)
Platelets: 174 10*3/uL (ref 150–400)
RBC: 5.02 MIL/uL (ref 3.87–5.11)
RDW: 12.8 % (ref 11.5–15.5)
WBC: 8.8 10*3/uL (ref 4.0–10.5)

## 2015-10-15 LAB — URINALYSIS, ROUTINE W REFLEX MICROSCOPIC
BILIRUBIN URINE: NEGATIVE
GLUCOSE, UA: NEGATIVE mg/dL
Ketones, ur: NEGATIVE mg/dL
Nitrite: NEGATIVE
PH: 6 (ref 5.0–8.0)
Protein, ur: NEGATIVE mg/dL

## 2015-10-15 LAB — BASIC METABOLIC PANEL
Anion gap: 7 (ref 5–15)
BUN: 24 mg/dL — AB (ref 6–20)
CHLORIDE: 100 mmol/L — AB (ref 101–111)
CO2: 31 mmol/L (ref 22–32)
CREATININE: 1.05 mg/dL — AB (ref 0.44–1.00)
Calcium: 9.7 mg/dL (ref 8.9–10.3)
GFR calc Af Amer: 53 mL/min — ABNORMAL LOW (ref >60)
GFR calc non Af Amer: 46 mL/min — ABNORMAL LOW (ref >60)
GLUCOSE: 128 mg/dL — AB (ref 65–99)
POTASSIUM: 4.2 mmol/L (ref 3.5–5.1)
SODIUM: 138 mmol/L (ref 135–145)

## 2015-10-15 MED ORDER — TRAMADOL HCL 50 MG PO TABS: 50.0000 mg | ORAL_TABLET | Freq: Four times a day (QID) | ORAL | 0 refills | Status: DC | PRN

## 2015-10-15 MED ORDER — CIPROFLOXACIN HCL 250 MG PO TABS
500.0000 mg | ORAL_TABLET | Freq: Once | ORAL | Status: AC
  Administered 2015-10-15: 500 mg via ORAL
  Filled 2015-10-15: qty 2

## 2015-10-15 MED ORDER — CIPROFLOXACIN HCL 500 MG PO TABS: 500.0000 mg | ORAL_TABLET | Freq: Two times a day (BID) | ORAL | 0 refills | Status: DC

## 2015-10-15 NOTE — ED Triage Notes (Signed)
Pt reports bilateral hip/buttock pain that radiates down into legs. States pain worsens in the evening. Denies recent injury/fall.

## 2015-10-15 NOTE — ED Provider Notes (Signed)
[facility] DEPT Provider Note   CSN: [id number] Arrival date & time: 10/15/15  0745     History   Chief Complaint Chief Complaint  Patient presents with  . Hip Pain    HPI Sharon Scott is a 82 y.o. female.  42-year-old female without a significant past medical history presents to the emergency department with a few days of progressively worsening bilateral hip pain worse at the end of the day. Better when she wakes up in the morning. She has tried Tylenol to help alleviate his symptoms and this works intermittently. Has not had a history of the same. No weakness in lower extremities or numbness anywhere. No bowel or bladder continence. No recent fevers. No trauma that she remembers. She does have some suprapubic abdominal pain and pressure worse with urination. No dysuria.       Past Medical History:  Diagnosis Date  . Shingles 1990  . Thyroid disease    hypothyroid    Patient Active Problem List   Diagnosis Date Noted  . CLOSED BIMALLEOLAR FRACTURE 12/31/2009    Past Surgical History:  Procedure Laterality Date  . ABDOMINAL HYSTERECTOMY  1968  . ANKLE SURGERY Right 2011   plate and 7 screws    OB History    No data available       Home Medications    Prior to Admission medications   Medication Sig Start Date End Date Taking? Authorizing Provider  calcium citrate (CALCITRATE - DOSED IN MG ELEMENTAL CALCIUM) 950 MG tablet Take 600 mg by mouth daily.   Yes Historical Provider, MD  cholecalciferol (VITAMIN D) 1000 UNITS tablet Take 1,000 Units by mouth daily.   Yes Historical Provider, MD  Multiple Vitamins-Minerals (MULTIVITAMIN WITH MINERALS) tablet Take 1 tablet by mouth daily.   Yes Historical Provider, MD  SYNTHROID 50 MCG tablet Take 50 mcg by mouth daily. 09/18/15  Yes Historical Provider, MD  ciprofloxacin (CIPRO) 500 MG tablet Take 1 tablet (500 mg total) by mouth 2 (two) times daily. One po bid x 7 days 10/15/15   Merrily Pew, MD  Elastic Bandages &  Supports ([place]) MISC by Does not apply route.      Historical Provider, MD  FOSAMAX PLUS D 70-5600 MG-UNIT tablet Take 1 tablet by mouth once a week. sunday 07/28/15   Historical Provider, MD  traMADol (ULTRAM) 50 MG tablet Take 1 tablet (50 mg total) by mouth every 6 (six) hours as needed. 10/15/15   Merrily Pew, MD    Family History No family history on file.  Social History Social History  Substance Use Topics  . Smoking status: Never Smoker  . Smokeless tobacco: Never Used  . Alcohol use No     Allergies   Clindamycin/lincomycin and Amoxicillin   Review of Systems Review of Systems  Constitutional: Negative for chills and fever.  HENT: Negative for ear pain and sore throat.   Eyes: Negative for pain and visual disturbance.  Respiratory: Negative for cough and shortness of breath.   Cardiovascular: Negative for chest pain and palpitations.  Gastrointestinal: Positive for abdominal pain (suprapubic). Negative for vomiting.  Genitourinary: Positive for dysuria. Negative for hematuria and pelvic pain.  Musculoskeletal: Positive for back pain. Negative for arthralgias.  Skin: Negative for color change and rash.  Neurological: Negative for seizures and syncope.  All other systems reviewed and are negative.    Physical Exam Updated Vital Signs BP 132/68   Pulse 88   Temp 98 F (36.7 C) (  Oral)   Resp 16   Ht 5\' 5"  (1.651 m)   Wt 138 lb (62.6 kg)   SpO2 98%   BMI 22.96 kg/m   Physical Exam  Constitutional: She appears well-developed and well-nourished. No distress.  HENT:  Head: Normocephalic and atraumatic.  Eyes: Conjunctivae are normal.  Neck: Neck supple.  Cardiovascular: Normal rate and regular rhythm.   No murmur heard. Pulmonary/Chest: Effort normal and breath sounds normal. No respiratory distress.  Abdominal: Soft. There is no tenderness.  Musculoskeletal: She exhibits no edema.  Neurological: She is alert.  Skin: Skin is warm and dry.    Psychiatric: She has a normal mood and affect.  Nursing note and vitals reviewed.    ED Treatments / Results  Labs (all labs ordered are listed, but only abnormal results are displayed) Labs Reviewed  URINE CULTURE - Abnormal; Notable for the following:       Result Value   Culture   (*)    Value: <10,000 COLONIES/mL INSIGNIFICANT GROWTH Performed at [facility]    All other components within normal limits  BASIC METABOLIC PANEL - Abnormal; Notable for the following:    Chloride 100 (*)    Glucose, Bld 128 (*)    BUN 24 (*)    Creatinine, Ser 1.05 (*)    GFR calc non Af Amer 46 (*)    GFR calc Af Amer 53 (*)    All other components within normal limits  URINALYSIS, ROUTINE W REFLEX MICROSCOPIC (NOT AT [facility]) - Abnormal; Notable for the following:    Specific Gravity, Urine <1.005 (*)    Hgb urine dipstick TRACE (*)    Leukocytes, UA MODERATE (*)    All other components within normal limits  URINE MICROSCOPIC-ADD ON - Abnormal; Notable for the following:    Squamous Epithelial / LPF 0-5 (*)    Bacteria, UA RARE (*)    All other components within normal limits  CBC WITH DIFFERENTIAL/PLATELET    EKG  EKG Interpretation None       Radiology Dg Pelvis 1-2 Views  Result Date: 10/15/2015 CLINICAL DATA:  82 year old with pelvic pain for 2 days. No known injury. EXAM: PELVIS - 1-2 VIEW COMPARISON:  Lumbar spine radiographs 04/11/2007. FINDINGS: The bones are demineralized. There is no evidence of acute fracture or dislocation. There mild degenerative changes of the hips, right-greater-than-left. No evidence of femoral head avascular necrosis. The sacroiliac joints appear unremarkable. IMPRESSION: No acute findings. Mild osteoarthritis of the hips, right greater than left. Electronically Signed   By: Richardean Sale M.D.   On: 10/15/2015 08:55    Procedures Procedures (including critical care time)  Medications Ordered in ED Medications  ciprofloxacin (CIPRO)  tablet 500 mg (500 mg Oral Given 10/15/15 1117)     Initial Impression / Assessment and Plan / ED Course  I have reviewed the triage vital signs and the nursing notes.  Pertinent labs & imaging results that were available during my care of the patient were reviewed by me and considered in my medical decision making (see chart for details).  Clinical Course    Hip pain likely 2/2 OA, lessl ikely pyelo. No e/o spinal bony abnormality. Plan for NSAIDs, PCP follow up and abx for likely UTI.   Final Clinical Impressions(s) / ED Diagnoses   Final diagnoses:  UTI (lower urinary tract infection)    New Prescriptions Discharge Medication List as of 10/15/2015 12:18 PM    START taking these medications   Details  ciprofloxacin (CIPRO) 500 MG tablet Take 1 tablet (500 mg total) by mouth 2 (two) times daily. One po bid x 7 days, Starting Wed 10/15/2015, Print         Merrily Pew, MD 10/16/15 [phone]

## 2015-10-15 NOTE — ED Notes (Signed)
Awaiting pt's d/c instructions.  Pt informed.

## 2015-10-16 ENCOUNTER — Ambulatory Visit (HOSPITAL_COMMUNITY)
Admission: RE | Admit: 2015-10-16 | Discharge: 2015-10-16 | Disposition: A | Discharge status: Home / Self Care | Payer: Medicare Other | Source: Ambulatory Visit | Attending: Pulmonary Disease | Admitting: Pulmonary Disease

## 2015-10-16 ENCOUNTER — Other Ambulatory Visit (HOSPITAL_COMMUNITY): Payer: Self-pay | Admitting: Pulmonary Disease

## 2015-10-16 DIAGNOSIS — M79605 Pain in left leg: Secondary | ICD-10-CM

## 2015-10-16 DIAGNOSIS — M5137 Other intervertebral disc degeneration, lumbosacral region: Secondary | ICD-10-CM | POA: Diagnosis not present

## 2015-10-16 DIAGNOSIS — M79604 Pain in right leg: Secondary | ICD-10-CM

## 2015-10-16 DIAGNOSIS — M79606 Pain in leg, unspecified: Secondary | ICD-10-CM | POA: Diagnosis not present

## 2015-10-16 DIAGNOSIS — M47816 Spondylosis without myelopathy or radiculopathy, lumbar region: Secondary | ICD-10-CM | POA: Diagnosis not present

## 2015-10-16 LAB — URINE CULTURE

## 2015-10-31 ENCOUNTER — Other Ambulatory Visit (HOSPITAL_COMMUNITY): Payer: Self-pay | Admitting: Pulmonary Disease

## 2015-10-31 DIAGNOSIS — M545 Low back pain: Secondary | ICD-10-CM

## 2015-12-16 DIAGNOSIS — D045 Carcinoma in situ of skin of trunk: Secondary | ICD-10-CM | POA: Diagnosis not present

## 2015-12-24 DIAGNOSIS — M545 Low back pain: Secondary | ICD-10-CM | POA: Diagnosis not present

## 2015-12-24 DIAGNOSIS — E785 Hyperlipidemia, unspecified: Secondary | ICD-10-CM | POA: Diagnosis not present

## 2015-12-24 DIAGNOSIS — E039 Hypothyroidism, unspecified: Secondary | ICD-10-CM | POA: Diagnosis not present

## 2015-12-24 DIAGNOSIS — Z23 Encounter for immunization: Secondary | ICD-10-CM | POA: Diagnosis not present

## 2015-12-24 DIAGNOSIS — E875 Hyperkalemia: Secondary | ICD-10-CM | POA: Diagnosis not present

## 2015-12-29 DIAGNOSIS — M545 Low back pain: Secondary | ICD-10-CM | POA: Diagnosis not present

## 2015-12-29 DIAGNOSIS — E875 Hyperkalemia: Secondary | ICD-10-CM | POA: Diagnosis not present

## 2015-12-29 DIAGNOSIS — E785 Hyperlipidemia, unspecified: Secondary | ICD-10-CM | POA: Diagnosis not present

## 2015-12-29 DIAGNOSIS — E039 Hypothyroidism, unspecified: Secondary | ICD-10-CM | POA: Diagnosis not present

## 2016-01-05 DIAGNOSIS — Z23 Encounter for immunization: Secondary | ICD-10-CM | POA: Diagnosis not present

## 2016-01-07 DIAGNOSIS — Z124 Encounter for screening for malignant neoplasm of cervix: Secondary | ICD-10-CM | POA: Diagnosis not present

## 2016-01-07 DIAGNOSIS — Z1231 Encounter for screening mammogram for malignant neoplasm of breast: Secondary | ICD-10-CM | POA: Diagnosis not present

## 2016-01-08 DIAGNOSIS — B351 Tinea unguium: Secondary | ICD-10-CM | POA: Diagnosis not present

## 2016-01-08 DIAGNOSIS — I739 Peripheral vascular disease, unspecified: Secondary | ICD-10-CM | POA: Diagnosis not present

## 2016-03-24 DIAGNOSIS — I739 Peripheral vascular disease, unspecified: Secondary | ICD-10-CM | POA: Diagnosis not present

## 2016-03-24 DIAGNOSIS — B351 Tinea unguium: Secondary | ICD-10-CM | POA: Diagnosis not present

## 2016-03-30 DIAGNOSIS — D229 Melanocytic nevi, unspecified: Secondary | ICD-10-CM | POA: Diagnosis not present

## 2016-03-30 DIAGNOSIS — L821 Other seborrheic keratosis: Secondary | ICD-10-CM | POA: Diagnosis not present

## 2016-03-30 DIAGNOSIS — D0461 Carcinoma in situ of skin of right upper limb, including shoulder: Secondary | ICD-10-CM | POA: Diagnosis not present

## 2016-03-30 DIAGNOSIS — L570 Actinic keratosis: Secondary | ICD-10-CM | POA: Diagnosis not present

## 2016-04-21 DIAGNOSIS — N182 Chronic kidney disease, stage 2 (mild): Secondary | ICD-10-CM | POA: Diagnosis not present

## 2016-04-21 DIAGNOSIS — K580 Irritable bowel syndrome with diarrhea: Secondary | ICD-10-CM | POA: Diagnosis not present

## 2016-04-21 DIAGNOSIS — N3281 Overactive bladder: Secondary | ICD-10-CM | POA: Diagnosis not present

## 2016-04-21 DIAGNOSIS — E039 Hypothyroidism, unspecified: Secondary | ICD-10-CM | POA: Diagnosis not present

## 2016-06-10 DIAGNOSIS — B351 Tinea unguium: Secondary | ICD-10-CM | POA: Diagnosis not present

## 2016-06-10 DIAGNOSIS — I739 Peripheral vascular disease, unspecified: Secondary | ICD-10-CM | POA: Diagnosis not present

## 2016-08-17 DIAGNOSIS — N3281 Overactive bladder: Secondary | ICD-10-CM | POA: Diagnosis not present

## 2016-08-17 DIAGNOSIS — E039 Hypothyroidism, unspecified: Secondary | ICD-10-CM | POA: Diagnosis not present

## 2016-08-17 DIAGNOSIS — N182 Chronic kidney disease, stage 2 (mild): Secondary | ICD-10-CM | POA: Diagnosis not present

## 2016-08-17 DIAGNOSIS — K580 Irritable bowel syndrome with diarrhea: Secondary | ICD-10-CM | POA: Diagnosis not present

## 2016-08-19 DIAGNOSIS — N3281 Overactive bladder: Secondary | ICD-10-CM | POA: Diagnosis not present

## 2016-08-19 DIAGNOSIS — N182 Chronic kidney disease, stage 2 (mild): Secondary | ICD-10-CM | POA: Diagnosis not present

## 2016-08-19 DIAGNOSIS — M1990 Unspecified osteoarthritis, unspecified site: Secondary | ICD-10-CM | POA: Diagnosis not present

## 2016-08-19 DIAGNOSIS — E875 Hyperkalemia: Secondary | ICD-10-CM | POA: Diagnosis not present

## 2016-09-09 DIAGNOSIS — I739 Peripheral vascular disease, unspecified: Secondary | ICD-10-CM | POA: Diagnosis not present

## 2016-09-09 DIAGNOSIS — B351 Tinea unguium: Secondary | ICD-10-CM | POA: Diagnosis not present

## 2016-09-13 ENCOUNTER — Other Ambulatory Visit: Payer: Self-pay

## 2016-09-29 DIAGNOSIS — C4491 Basal cell carcinoma of skin, unspecified: Secondary | ICD-10-CM

## 2016-09-29 DIAGNOSIS — C44319 Basal cell carcinoma of skin of other parts of face: Secondary | ICD-10-CM | POA: Diagnosis not present

## 2016-09-29 HISTORY — DX: Basal cell carcinoma of skin, unspecified: C44.91

## 2016-10-14 DIAGNOSIS — C44319 Basal cell carcinoma of skin of other parts of face: Secondary | ICD-10-CM | POA: Diagnosis not present

## 2016-11-25 DIAGNOSIS — I739 Peripheral vascular disease, unspecified: Secondary | ICD-10-CM | POA: Diagnosis not present

## 2016-11-25 DIAGNOSIS — B351 Tinea unguium: Secondary | ICD-10-CM | POA: Diagnosis not present

## 2016-12-17 DIAGNOSIS — N182 Chronic kidney disease, stage 2 (mild): Secondary | ICD-10-CM | POA: Diagnosis not present

## 2016-12-17 DIAGNOSIS — N3281 Overactive bladder: Secondary | ICD-10-CM | POA: Diagnosis not present

## 2016-12-17 DIAGNOSIS — M1990 Unspecified osteoarthritis, unspecified site: Secondary | ICD-10-CM | POA: Diagnosis not present

## 2016-12-17 DIAGNOSIS — E785 Hyperlipidemia, unspecified: Secondary | ICD-10-CM | POA: Diagnosis not present

## 2016-12-21 DIAGNOSIS — Z23 Encounter for immunization: Secondary | ICD-10-CM | POA: Diagnosis not present

## 2016-12-21 DIAGNOSIS — Z0000 Encounter for general adult medical examination without abnormal findings: Secondary | ICD-10-CM | POA: Diagnosis not present

## 2016-12-28 DIAGNOSIS — L821 Other seborrheic keratosis: Secondary | ICD-10-CM | POA: Diagnosis not present

## 2016-12-28 DIAGNOSIS — D492 Neoplasm of unspecified behavior of bone, soft tissue, and skin: Secondary | ICD-10-CM | POA: Diagnosis not present

## 2016-12-28 DIAGNOSIS — D229 Melanocytic nevi, unspecified: Secondary | ICD-10-CM | POA: Diagnosis not present

## 2017-01-07 ENCOUNTER — Other Ambulatory Visit: Payer: Self-pay | Admitting: Obstetrics & Gynecology

## 2017-01-07 DIAGNOSIS — Z124 Encounter for screening for malignant neoplasm of cervix: Secondary | ICD-10-CM | POA: Diagnosis not present

## 2017-01-07 DIAGNOSIS — M8000XD Age-related osteoporosis with current pathological fracture, unspecified site, subsequent encounter for fracture with routine healing: Secondary | ICD-10-CM

## 2017-01-07 DIAGNOSIS — M810 Age-related osteoporosis without current pathological fracture: Secondary | ICD-10-CM | POA: Diagnosis not present

## 2017-01-07 DIAGNOSIS — Z7189 Other specified counseling: Secondary | ICD-10-CM | POA: Diagnosis not present

## 2017-01-24 DIAGNOSIS — H43813 Vitreous degeneration, bilateral: Secondary | ICD-10-CM | POA: Diagnosis not present

## 2017-02-04 ENCOUNTER — Ambulatory Visit
Admission: RE | Admit: 2017-02-04 | Discharge: 2017-02-04 | Disposition: A | Discharge status: Home / Self Care | Payer: Medicare Other | Source: Ambulatory Visit | Attending: Obstetrics & Gynecology | Admitting: Obstetrics & Gynecology

## 2017-02-04 DIAGNOSIS — M8000XD Age-related osteoporosis with current pathological fracture, unspecified site, subsequent encounter for fracture with routine healing: Secondary | ICD-10-CM

## 2017-02-04 DIAGNOSIS — M810 Age-related osteoporosis without current pathological fracture: Secondary | ICD-10-CM | POA: Diagnosis not present

## 2017-02-04 DIAGNOSIS — Z780 Asymptomatic menopausal state: Secondary | ICD-10-CM | POA: Diagnosis not present

## 2017-02-04 LAB — HM DEXA SCAN

## 2017-02-23 DIAGNOSIS — B351 Tinea unguium: Secondary | ICD-10-CM | POA: Diagnosis not present

## 2017-02-23 DIAGNOSIS — I739 Peripheral vascular disease, unspecified: Secondary | ICD-10-CM | POA: Diagnosis not present

## 2017-03-11 DIAGNOSIS — M47812 Spondylosis without myelopathy or radiculopathy, cervical region: Secondary | ICD-10-CM | POA: Diagnosis not present

## 2017-03-11 DIAGNOSIS — M9901 Segmental and somatic dysfunction of cervical region: Secondary | ICD-10-CM | POA: Diagnosis not present

## 2017-04-05 DIAGNOSIS — M810 Age-related osteoporosis without current pathological fracture: Secondary | ICD-10-CM | POA: Diagnosis not present

## 2017-04-05 DIAGNOSIS — M1991 Primary osteoarthritis, unspecified site: Secondary | ICD-10-CM | POA: Diagnosis not present

## 2017-04-05 DIAGNOSIS — N182 Chronic kidney disease, stage 2 (mild): Secondary | ICD-10-CM | POA: Diagnosis not present

## 2017-04-05 DIAGNOSIS — E872 Acidosis: Secondary | ICD-10-CM | POA: Diagnosis not present

## 2017-04-25 ENCOUNTER — Ambulatory Visit (INDEPENDENT_AMBULATORY_CARE_PROVIDER_SITE_OTHER): Payer: Medicare Other | Admitting: Otolaryngology

## 2017-04-25 DIAGNOSIS — H9313 Tinnitus, bilateral: Secondary | ICD-10-CM | POA: Diagnosis not present

## 2017-04-25 DIAGNOSIS — H903 Sensorineural hearing loss, bilateral: Secondary | ICD-10-CM

## 2017-04-25 DIAGNOSIS — H6983 Other specified disorders of Eustachian tube, bilateral: Secondary | ICD-10-CM | POA: Diagnosis not present

## 2017-05-12 DIAGNOSIS — I739 Peripheral vascular disease, unspecified: Secondary | ICD-10-CM | POA: Diagnosis not present

## 2017-05-12 DIAGNOSIS — B351 Tinea unguium: Secondary | ICD-10-CM | POA: Diagnosis not present

## 2017-07-15 DIAGNOSIS — S81812A Laceration without foreign body, left lower leg, initial encounter: Secondary | ICD-10-CM | POA: Diagnosis not present

## 2017-07-19 DIAGNOSIS — S81812D Laceration without foreign body, left lower leg, subsequent encounter: Secondary | ICD-10-CM | POA: Diagnosis not present

## 2017-07-20 DIAGNOSIS — S81812D Laceration without foreign body, left lower leg, subsequent encounter: Secondary | ICD-10-CM | POA: Diagnosis not present

## 2017-07-20 DIAGNOSIS — S81811D Laceration without foreign body, right lower leg, subsequent encounter: Secondary | ICD-10-CM | POA: Diagnosis not present

## 2017-07-22 ENCOUNTER — Encounter: Payer: Self-pay | Admitting: Family Medicine

## 2017-07-22 DIAGNOSIS — S81812D Laceration without foreign body, left lower leg, subsequent encounter: Secondary | ICD-10-CM | POA: Diagnosis not present

## 2017-07-22 DIAGNOSIS — S81811D Laceration without foreign body, right lower leg, subsequent encounter: Secondary | ICD-10-CM | POA: Diagnosis not present

## 2017-07-22 DIAGNOSIS — N182 Chronic kidney disease, stage 2 (mild): Secondary | ICD-10-CM | POA: Diagnosis not present

## 2017-07-22 DIAGNOSIS — M810 Age-related osteoporosis without current pathological fracture: Secondary | ICD-10-CM | POA: Diagnosis not present

## 2017-07-27 DIAGNOSIS — S81811D Laceration without foreign body, right lower leg, subsequent encounter: Secondary | ICD-10-CM | POA: Diagnosis not present

## 2017-07-28 DIAGNOSIS — I739 Peripheral vascular disease, unspecified: Secondary | ICD-10-CM | POA: Diagnosis not present

## 2017-07-28 DIAGNOSIS — B351 Tinea unguium: Secondary | ICD-10-CM | POA: Diagnosis not present

## 2017-07-29 DIAGNOSIS — E039 Hypothyroidism, unspecified: Secondary | ICD-10-CM | POA: Diagnosis not present

## 2017-07-29 DIAGNOSIS — N182 Chronic kidney disease, stage 2 (mild): Secondary | ICD-10-CM | POA: Diagnosis not present

## 2017-07-29 DIAGNOSIS — M810 Age-related osteoporosis without current pathological fracture: Secondary | ICD-10-CM | POA: Diagnosis not present

## 2017-07-29 DIAGNOSIS — E875 Hyperkalemia: Secondary | ICD-10-CM | POA: Diagnosis not present

## 2017-08-03 DIAGNOSIS — C44319 Basal cell carcinoma of skin of other parts of face: Secondary | ICD-10-CM | POA: Diagnosis not present

## 2017-08-03 DIAGNOSIS — C4491 Basal cell carcinoma of skin, unspecified: Secondary | ICD-10-CM

## 2017-08-03 DIAGNOSIS — L570 Actinic keratosis: Secondary | ICD-10-CM | POA: Diagnosis not present

## 2017-08-03 DIAGNOSIS — S81811D Laceration without foreign body, right lower leg, subsequent encounter: Secondary | ICD-10-CM | POA: Diagnosis not present

## 2017-08-03 DIAGNOSIS — D229 Melanocytic nevi, unspecified: Secondary | ICD-10-CM | POA: Diagnosis not present

## 2017-08-03 DIAGNOSIS — L821 Other seborrheic keratosis: Secondary | ICD-10-CM | POA: Diagnosis not present

## 2017-08-03 HISTORY — DX: Basal cell carcinoma of skin, unspecified: C44.91

## 2017-08-10 ENCOUNTER — Ambulatory Visit (HOSPITAL_COMMUNITY)
Admission: RE | Admit: 2017-08-10 | Discharge: 2017-08-10 | Disposition: A | Discharge status: Home / Self Care | Payer: Medicare Other | Source: Ambulatory Visit | Attending: Pulmonary Disease | Admitting: Pulmonary Disease

## 2017-08-10 ENCOUNTER — Other Ambulatory Visit (HOSPITAL_COMMUNITY): Payer: Self-pay | Admitting: Pulmonary Disease

## 2017-08-10 DIAGNOSIS — J984 Other disorders of lung: Secondary | ICD-10-CM | POA: Diagnosis not present

## 2017-08-10 DIAGNOSIS — R05 Cough: Secondary | ICD-10-CM | POA: Diagnosis not present

## 2017-08-10 DIAGNOSIS — R059 Cough, unspecified: Secondary | ICD-10-CM

## 2017-08-10 DIAGNOSIS — J449 Chronic obstructive pulmonary disease, unspecified: Secondary | ICD-10-CM | POA: Insufficient documentation

## 2017-08-22 DIAGNOSIS — J209 Acute bronchitis, unspecified: Secondary | ICD-10-CM | POA: Diagnosis not present

## 2017-08-22 DIAGNOSIS — L03119 Cellulitis of unspecified part of limb: Secondary | ICD-10-CM | POA: Diagnosis not present

## 2017-08-22 DIAGNOSIS — C4490 Unspecified malignant neoplasm of skin, unspecified: Secondary | ICD-10-CM | POA: Diagnosis not present

## 2017-08-22 DIAGNOSIS — N182 Chronic kidney disease, stage 2 (mild): Secondary | ICD-10-CM | POA: Diagnosis not present

## 2017-09-26 DIAGNOSIS — C44319 Basal cell carcinoma of skin of other parts of face: Secondary | ICD-10-CM | POA: Diagnosis not present

## 2017-10-26 DIAGNOSIS — I739 Peripheral vascular disease, unspecified: Secondary | ICD-10-CM | POA: Diagnosis not present

## 2017-10-26 DIAGNOSIS — B351 Tinea unguium: Secondary | ICD-10-CM | POA: Diagnosis not present

## 2017-11-28 DIAGNOSIS — E785 Hyperlipidemia, unspecified: Secondary | ICD-10-CM | POA: Diagnosis not present

## 2017-11-28 DIAGNOSIS — N189 Chronic kidney disease, unspecified: Secondary | ICD-10-CM | POA: Diagnosis not present

## 2017-11-28 DIAGNOSIS — N182 Chronic kidney disease, stage 2 (mild): Secondary | ICD-10-CM | POA: Diagnosis not present

## 2017-11-28 DIAGNOSIS — E039 Hypothyroidism, unspecified: Secondary | ICD-10-CM | POA: Diagnosis not present

## 2017-11-28 DIAGNOSIS — M310 Hypersensitivity angiitis: Secondary | ICD-10-CM | POA: Diagnosis not present

## 2017-11-28 DIAGNOSIS — E875 Hyperkalemia: Secondary | ICD-10-CM | POA: Diagnosis not present

## 2017-11-28 LAB — CBC AND DIFFERENTIAL
HCT: 41 (ref 36–46)
Hemoglobin: 13.2 (ref 12.0–16.0)
Neutrophils Absolute: 4
Platelets: 172 (ref 150–399)
WBC: 6.9

## 2017-11-28 LAB — BASIC METABOLIC PANEL
BUN: 27 — AB (ref 4–21)
CO2: 23 — AB (ref 13–22)
Chloride: 103 (ref 99–108)
Creatinine: 0.9 (ref <=1.1)
Glucose: 103
Potassium: 4.9 (ref 3.4–5.3)
Sodium: 144 (ref 137–147)

## 2017-11-28 LAB — HEPATIC FUNCTION PANEL
ALT: 17 (ref 7–35)
AST: 22 (ref 13–35)
Alkaline Phosphatase: 79 (ref 25–125)
Bilirubin, Total: 0.4

## 2017-11-28 LAB — TSH: TSH: 3.11 (ref <=5.90)

## 2017-11-28 LAB — COMPREHENSIVE METABOLIC PANEL
GFR calc Af Amer: 62
GFR calc non Af Amer: 54

## 2017-11-28 LAB — LIPID PANEL
Cholesterol: 173 (ref 0–200)
HDL: 76 — AB (ref 35–70)
LDL Cholesterol: 79
Triglycerides: 92 (ref 40–160)

## 2017-11-28 LAB — CBC: RBC: 4.74 (ref 3.87–5.11)

## 2017-11-29 DIAGNOSIS — Z0000 Encounter for general adult medical examination without abnormal findings: Secondary | ICD-10-CM | POA: Diagnosis not present

## 2017-11-29 DIAGNOSIS — Z23 Encounter for immunization: Secondary | ICD-10-CM | POA: Diagnosis not present

## 2018-01-11 DIAGNOSIS — B351 Tinea unguium: Secondary | ICD-10-CM | POA: Diagnosis not present

## 2018-01-11 DIAGNOSIS — I739 Peripheral vascular disease, unspecified: Secondary | ICD-10-CM | POA: Diagnosis not present

## 2018-02-28 DIAGNOSIS — Z124 Encounter for screening for malignant neoplasm of cervix: Secondary | ICD-10-CM | POA: Diagnosis not present

## 2018-03-06 ENCOUNTER — Other Ambulatory Visit: Payer: Self-pay | Admitting: Obstetrics & Gynecology

## 2018-03-06 DIAGNOSIS — M810 Age-related osteoporosis without current pathological fracture: Secondary | ICD-10-CM

## 2018-03-29 DIAGNOSIS — B351 Tinea unguium: Secondary | ICD-10-CM | POA: Diagnosis not present

## 2018-03-29 DIAGNOSIS — I739 Peripheral vascular disease, unspecified: Secondary | ICD-10-CM | POA: Diagnosis not present

## 2018-04-04 DIAGNOSIS — E785 Hyperlipidemia, unspecified: Secondary | ICD-10-CM | POA: Diagnosis not present

## 2018-04-04 DIAGNOSIS — E039 Hypothyroidism, unspecified: Secondary | ICD-10-CM | POA: Diagnosis not present

## 2018-04-04 DIAGNOSIS — N182 Chronic kidney disease, stage 2 (mild): Secondary | ICD-10-CM | POA: Diagnosis not present

## 2018-04-04 DIAGNOSIS — R1312 Dysphagia, oropharyngeal phase: Secondary | ICD-10-CM | POA: Diagnosis not present

## 2018-05-01 ENCOUNTER — Ambulatory Visit (INDEPENDENT_AMBULATORY_CARE_PROVIDER_SITE_OTHER): Payer: Medicare Other | Admitting: Otolaryngology

## 2018-08-07 DIAGNOSIS — N182 Chronic kidney disease, stage 2 (mild): Secondary | ICD-10-CM | POA: Diagnosis not present

## 2018-08-07 DIAGNOSIS — R1312 Dysphagia, oropharyngeal phase: Secondary | ICD-10-CM | POA: Diagnosis not present

## 2018-08-07 DIAGNOSIS — E785 Hyperlipidemia, unspecified: Secondary | ICD-10-CM | POA: Diagnosis not present

## 2018-08-07 DIAGNOSIS — M1990 Unspecified osteoarthritis, unspecified site: Secondary | ICD-10-CM | POA: Diagnosis not present

## 2018-09-25 ENCOUNTER — Other Ambulatory Visit: Payer: Self-pay

## 2018-12-11 DIAGNOSIS — Z0000 Encounter for general adult medical examination without abnormal findings: Secondary | ICD-10-CM | POA: Diagnosis not present

## 2018-12-11 DIAGNOSIS — Z23 Encounter for immunization: Secondary | ICD-10-CM | POA: Diagnosis not present

## 2018-12-12 DIAGNOSIS — Z0000 Encounter for general adult medical examination without abnormal findings: Secondary | ICD-10-CM | POA: Diagnosis not present

## 2018-12-12 DIAGNOSIS — R1312 Dysphagia, oropharyngeal phase: Secondary | ICD-10-CM | POA: Diagnosis not present

## 2018-12-12 DIAGNOSIS — E039 Hypothyroidism, unspecified: Secondary | ICD-10-CM | POA: Diagnosis not present

## 2018-12-12 DIAGNOSIS — N182 Chronic kidney disease, stage 2 (mild): Secondary | ICD-10-CM | POA: Diagnosis not present

## 2018-12-12 DIAGNOSIS — M810 Age-related osteoporosis without current pathological fracture: Secondary | ICD-10-CM | POA: Diagnosis not present

## 2018-12-12 DIAGNOSIS — E785 Hyperlipidemia, unspecified: Secondary | ICD-10-CM | POA: Diagnosis not present

## 2018-12-13 LAB — COMPREHENSIVE METABOLIC PANEL
ALT: 21 (ref 3–30)
AST: 26
Albumin/Globulin Ratio: 1.5
Albumin: 4.2
Alkaline Phosphatase: 88
BUN/Creatinine Ratio: 33
BUN: 32 — AB (ref 4–21)
Calcium: 10.2
Carbon Dioxide, Total: 27
Chloride: 103
Creat: 0.98
EGFR (African American): 58
EGFR (Non-African Amer.): 50
Globulin, Total: 2.8
Glucose: 111
Potassium: 5.3
Sodium: 141
Total Bilirubin: 0.5
Total Protein: 7 (ref 6.4–8.2)

## 2018-12-13 LAB — LIPID PANEL
Cholesterol, Total: 192
HDL Cholesterol: 87 — AB (ref 35–70)
LDL Cholesterol: 91
TSH: 2.76
Triglycerides: 79 (ref 40–160)
VITAMIN D 25-HYDROXY: 36.5
VLDL Cholesterol Cal: 14

## 2018-12-13 LAB — CBC WITH DIFF/PLATELET
BASO(ABSOLUTE): 0.1
Basophils: 1
Eosinophils Absolute: 0
Eosinophils, %: 2
HCT: 44 — AB (ref 29–41)
Hemoglobin: 13.9
Immature Granulocytes: 0
Immature Granulocytes: 0
Lymphocytes absolute: 2.3 10*3/uL — AB (ref 0.1–1.8)
MCH: 28.1
MCHC: 32
MCV: 88 (ref 76–111)
Monocytes(Absolute): 0.8
Monocytes: 11
Neutro Abs: 3.7
Neutrophils: 53
RBC: 4.94 (ref 3.87–5.11)
RDW: 11.8
WBC: 7
lymph#: 33
platelet count: 189

## 2019-01-06 DIAGNOSIS — M810 Age-related osteoporosis without current pathological fracture: Secondary | ICD-10-CM

## 2019-01-06 DIAGNOSIS — M1990 Unspecified osteoarthritis, unspecified site: Secondary | ICD-10-CM

## 2019-01-06 DIAGNOSIS — N189 Chronic kidney disease, unspecified: Secondary | ICD-10-CM | POA: Insufficient documentation

## 2019-01-06 DIAGNOSIS — R1312 Dysphagia, oropharyngeal phase: Secondary | ICD-10-CM

## 2019-01-06 DIAGNOSIS — N182 Chronic kidney disease, stage 2 (mild): Secondary | ICD-10-CM

## 2019-01-06 DIAGNOSIS — E039 Hypothyroidism, unspecified: Secondary | ICD-10-CM

## 2019-01-06 DIAGNOSIS — E785 Hyperlipidemia, unspecified: Secondary | ICD-10-CM

## 2019-01-06 DIAGNOSIS — N3281 Overactive bladder: Secondary | ICD-10-CM

## 2019-01-23 ENCOUNTER — Encounter: Payer: Self-pay | Admitting: Family Medicine

## 2019-01-23 ENCOUNTER — Other Ambulatory Visit: Payer: Self-pay

## 2019-01-23 ENCOUNTER — Ambulatory Visit (INDEPENDENT_AMBULATORY_CARE_PROVIDER_SITE_OTHER): Payer: Medicare Other | Admitting: Family Medicine

## 2019-01-23 VITALS — BP 148/68 | HR 81 | Temp 97.6°F | Ht 65.0 in | Wt 119.2 lb

## 2019-01-23 DIAGNOSIS — E039 Hypothyroidism, unspecified: Secondary | ICD-10-CM | POA: Diagnosis not present

## 2019-01-23 DIAGNOSIS — E785 Hyperlipidemia, unspecified: Secondary | ICD-10-CM | POA: Diagnosis not present

## 2019-01-23 DIAGNOSIS — N182 Chronic kidney disease, stage 2 (mild): Secondary | ICD-10-CM

## 2019-01-23 DIAGNOSIS — M810 Age-related osteoporosis without current pathological fracture: Secondary | ICD-10-CM | POA: Diagnosis not present

## 2019-01-23 NOTE — Progress Notes (Signed)
New Patient Office Visit  Subjective:  Patient ID: Sharon Scott, female    DOB: March 08, 1924  Age: 85 y.o. MRN: [id number]  CC:  Chief Complaint  Patient presents with  . Establish Care  hypothyroid CKD  HPI Sharon Scott presents for CKD//hypothyroid/osteoporosis/hyperlipidemia  Past Medical History:  Diagnosis Date  . Age-related osteoporosis without current pathological fracture   . Cancer ([place])    skin  . Chronic kidney disease, stage II (mild)   . Chronic kidney disease, unspecified   . Dysphagia, oropharyngeal phase   . Hyperlipidemia, unspecified   . Hypothyroidism, unspecified   . Irritable bowel syndrome with diarrhea   . OAB (overactive bladder)   . Shingles 1990  . Thyroid disease    hypothyroid  . Unspecified osteoarthritis, unspecified site     Past Surgical History:  Procedure Laterality Date  . ABDOMINAL HYSTERECTOMY  1968  . ANKLE SURGERY Right 2011   plate and 7 screws  . FRACTURE SURGERY      Family History  Problem Relation Age of Onset  . Healthy Mother   . Healthy Father   . Stroke Father     Social History   Socioeconomic History  . Marital status: Widowed    Spouse name: Not on file  . Number of children: Not on file  . Years of education: Not on file  . Highest education level: Not on file  Occupational History  . Occupation: retired  Scientific laboratory technician  . Financial resource strain: Not on file  . Food insecurity    Worry: Not on file    Inability: Not on file  . Transportation needs    Medical: Not on file    Non-medical: Not on file  Tobacco Use  . Smoking status: Never Smoker  . Smokeless tobacco: Never Used  Substance and Sexual Activity  . Alcohol use: No  . Drug use: No  . Sexual activity: Not Currently  Lifestyle  . Physical activity    Days per week: Not on file    Minutes per session: Not on file  . Stress: Not on file  Relationships  . Social Herbalist on phone: Not on file    Gets together: Not  on file    Attends religious service: Not on file    Active member of club or organization: Not on file    Attends meetings of clubs or organizations: Not on file    Relationship status: Not on file  . Intimate partner violence    Fear of current or ex partner: Not on file    Emotionally abused: Not on file    Physically abused: Not on file    Forced sexual activity: Not on file  Other Topics Concern  . Not on file  Social History Narrative  . Not on file    ROS Review of Systems  Constitutional: Positive for activity change.  HENT: Positive for drooling, hearing loss, sinus pressure, trouble swallowing and voice change.   Eyes:       Cataract surgery bilat  Respiratory: Positive for choking.   Endocrine: Positive for cold intolerance.  Genitourinary: Positive for frequency.  Musculoskeletal: Positive for neck stiffness.  Skin: Positive for color change.  Hematological: Bruises/bleeds easily.    Objective:   Today's Vitals: BP (!) 148/68 (BP Location: Left Arm, Patient Position: Sitting, Cuff Size: Normal)   Pulse 81   Temp 97.6 F (36.4 C) (Oral)   Ht 5\' 5"  (1.651  m)   Wt 119 lb 3.2 oz (54.1 kg)   SpO2 95%   BMI 19.84 kg/m   Physical Exam Vitals signs reviewed.  Constitutional:      Appearance: Normal appearance.  HENT:     Head: Normocephalic and atraumatic.  Eyes:     Conjunctiva/sclera: Conjunctivae normal.  Neck:     Musculoskeletal: Normal range of motion.  Cardiovascular:     Rate and Rhythm: Normal rate and regular rhythm.     Pulses: Normal pulses.     Heart sounds: Normal heart sounds.  Pulmonary:     Effort: Pulmonary effort is normal.     Breath sounds: Normal breath sounds.  Musculoskeletal: Normal range of motion.  Neurological:     Mental Status: She is alert and oriented to person, place, and time.  Psychiatric:        Mood and Affect: Mood normal.        Behavior: Behavior normal.     Assessment & Plan:   1. Hypothyroidism,  unspecified type Synthroid-will check recent labwork completed by Dr. Luan Pulling  2. Hyperlipidemia, unspecified hyperlipidemia type Recent labwork-will check with Dr. Luan Pulling  3. Chronic kidney disease, stage II (mild) Recent labwork  4. Age-related osteoporosis without current pathological fracture Vit D + Calcium-fall risk d/w pt Outpatient Encounter Medications as of 01/23/2019  Medication Sig  . calcium citrate (CALCITRATE - DOSED IN MG ELEMENTAL CALCIUM) 950 MG tablet Take 600 mg by mouth daily.  . cholecalciferol (VITAMIN D) 1000 UNITS tablet Take 1,000 Units by mouth daily.  . fluticasone (FLONASE) 50 MCG/ACT nasal spray Place 2 sprays into both nostrils daily.  . Multiple Vitamins-Minerals (MULTIVITAMIN WITH MINERALS) tablet Take 1 tablet by mouth daily.  . niacinamide 500 MG tablet Take 500 mg by mouth 2 (two) times daily with a meal.  . SYNTHROID 50 MCG tablet Take 50 mcg by mouth daily.  . vitamin B-12 (CYANOCOBALAMIN) 1000 MCG tablet Take 1,000 mcg by mouth daily.  . vitamin C (ASCORBIC ACID) 500 MG tablet Take 500 mg by mouth daily.  . [DISCONTINUED] ciprofloxacin (CIPRO) 500 MG tablet Take 1 tablet (500 mg total) by mouth 2 (two) times daily. One po bid x 7 days (Patient not taking: Reported on 01/23/2019)  . [DISCONTINUED] Elastic Bandages & Supports ([place]) MISC by Does not apply route.    . [DISCONTINUED] FOSAMAX PLUS D 70-5600 MG-UNIT tablet Take 1 tablet by mouth once a week. sunday  . [DISCONTINUED] traMADol (ULTRAM) 50 MG tablet Take 1 tablet (50 mg total) by mouth every 6 (six) hours as needed. (Patient not taking: Reported on 01/23/2019)   No facility-administered encounter medications on file as of 01/23/2019.   Gagan Dillion Hannah Beat, MD

## 2019-02-19 ENCOUNTER — Telehealth: Payer: Self-pay | Admitting: Family Medicine

## 2019-02-19 NOTE — Telephone Encounter (Signed)
See if pt has taken blood pressure? Can patient check glucose reading? Recommend pt increase fluids and make sure adequate nutrition-ate breakfast-carb/protein and lunch.  If pt lives alone, have a family member or friend check in today ER-CP/SOB/Loss of strength in hands/feet/slurred speech or other concerns Schedule tomorrow for evaluation

## 2019-02-19 NOTE — Telephone Encounter (Signed)
Patient is calling and states she has been shaking every once in awhile and she has felt "swimmy" headed today and her knees are weak. She is wanting to know if Dr. Holly Bodily wants her to come into the office. She states her neighbor checked her pulse and that was fine.

## 2019-02-19 NOTE — Telephone Encounter (Signed)
BP was up this morning it has came down shaking stopped since she said she had a little bit of a headache afterwards she wants to wait and see how she feels in the morning before getting out and coming in she will call in the morning with how she is feeling

## 2019-02-19 NOTE — Telephone Encounter (Signed)
Routing to Dr. Corum for advice ? 

## 2019-02-20 ENCOUNTER — Encounter: Payer: Self-pay | Admitting: Family Medicine

## 2019-02-20 ENCOUNTER — Other Ambulatory Visit: Payer: Self-pay

## 2019-02-20 ENCOUNTER — Ambulatory Visit (INDEPENDENT_AMBULATORY_CARE_PROVIDER_SITE_OTHER): Payer: Medicare Other | Admitting: Family Medicine

## 2019-02-20 VITALS — BP 130/70 | HR 91 | Temp 98.6°F | Ht 65.0 in | Wt 117.6 lb

## 2019-02-20 DIAGNOSIS — E875 Hyperkalemia: Secondary | ICD-10-CM | POA: Diagnosis not present

## 2019-02-20 DIAGNOSIS — R42 Dizziness and giddiness: Secondary | ICD-10-CM | POA: Insufficient documentation

## 2019-02-20 DIAGNOSIS — R7309 Other abnormal glucose: Secondary | ICD-10-CM

## 2019-02-20 DIAGNOSIS — N189 Chronic kidney disease, unspecified: Secondary | ICD-10-CM | POA: Diagnosis not present

## 2019-02-20 DIAGNOSIS — R82998 Other abnormal findings in urine: Secondary | ICD-10-CM | POA: Diagnosis not present

## 2019-02-20 LAB — POCT URINALYSIS DIP (CLINITEK)
Bilirubin, UA: NEGATIVE
Blood, UA: NEGATIVE
Glucose, UA: NEGATIVE mg/dL
Ketones, POC UA: NEGATIVE mg/dL
Nitrite, UA: NEGATIVE
POC PROTEIN,UA: NEGATIVE
Spec Grav, UA: >=1.03 — AB (ref 1.010–1.025)
Urobilinogen, UA: 0.2 E.U./dL
pH, UA: 5.5 (ref 5.0–8.0)

## 2019-02-20 NOTE — Progress Notes (Signed)
Established Patient Office Visit  Subjective:  Patient ID: Sharon Scott, female    DOB: 1924/03/22  Age: 85 y.o. MRN: [id number]  CC:  Chief Complaint  Patient presents with  . Dizziness  . Shaking    HPI Sharon Scott presents for 12/28-dizziness and shaking sensation-standing ," just stood up" when onset of symptoms -checked blood pressure 169/71, elevated HR 153/77(morning), 125/65-today Pt states she ate breakfast, water, coffee, used flonase. No CP/SOB--became upset due to concerns for COVID related to symptoms. Pt states she had normal sleep and appetite.  Urinary concerns-pressure sensation. Cough--clear mucous No n/v/d. Pt with pulled muscle right lower abdomen. Pt states no concern with strength.   Reviewed old records-labwork, clinical notes Past Medical History:  Diagnosis Date  . Age-related osteoporosis without current pathological fracture   . Cancer ([place])    skin  . Chronic kidney disease, stage II (mild)   . Chronic kidney disease, unspecified   . Dysphagia, oropharyngeal phase   . Hyperlipidemia, unspecified   . Hypothyroidism, unspecified   . Irritable bowel syndrome with diarrhea   . OAB (overactive bladder)   . Shingles 1990  . Thyroid disease    hypothyroid  . Unspecified osteoarthritis, unspecified site     Past Surgical History:  Procedure Laterality Date  . ABDOMINAL HYSTERECTOMY  1968  . ANKLE SURGERY Right 2011   plate and 7 screws  . FRACTURE SURGERY      Family History  Problem Relation Age of Onset  . Healthy Mother   . Healthy Father   . Stroke Father     Social History   Socioeconomic History  . Marital status: Widowed    Spouse name: Not on file  . Number of children: Not on file  . Years of education: Not on file  . Highest education level: Not on file  Occupational History  . Occupation: retired  Tobacco Use  . Smoking status: Never Smoker  . Smokeless tobacco: Never Used  Substance and Sexual Activity  . Alcohol  use: No  . Drug use: No  . Sexual activity: Not Currently  Other Topics Concern  . Not on file  Social History Narrative  . Not on file   Social Determinants of Health   Financial Resource Strain:   . Difficulty of Paying Living Expenses: Not on file  Food Insecurity:   . Worried About Charity fundraiser in the Last Year: Not on file  . Ran Out of Food in the Last Year: Not on file  Transportation Needs:   . Lack of Transportation (Medical): Not on file  . Lack of Transportation (Non-Medical): Not on file  Physical Activity:   . Days of Exercise per Week: Not on file  . Minutes of Exercise per Session: Not on file  Stress:   . Feeling of Stress : Not on file  Social Connections:   . Frequency of Communication with Friends and Family: Not on file  . Frequency of Social Gatherings with Friends and Family: Not on file  . Attends Religious Services: Not on file  . Active Member of Clubs or Organizations: Not on file  . Attends Archivist Meetings: Not on file  . Marital Status: Not on file  Intimate Partner Violence:   . Fear of Current or Ex-Partner: Not on file  . Emotionally Abused: Not on file  . Physically Abused: Not on file  . Sexually Abused: Not on file    Outpatient Medications Prior  to Visit  Medication Sig Dispense Refill  . calcium citrate (CALCITRATE - DOSED IN MG ELEMENTAL CALCIUM) 950 MG tablet Take 600 mg by mouth daily.    . cholecalciferol (VITAMIN D) 1000 UNITS tablet Take 1,000 Units by mouth daily.    . fluticasone (FLONASE) 50 MCG/ACT nasal spray Place 2 sprays into both nostrils daily.    . Multiple Vitamins-Minerals (MULTIVITAMIN WITH MINERALS) tablet Take 1 tablet by mouth daily.    . niacinamide 500 MG tablet Take 500 mg by mouth 2 (two) times daily with a meal.    . SYNTHROID 50 MCG tablet Take 50 mcg by mouth daily.    . vitamin B-12 (CYANOCOBALAMIN) 1000 MCG tablet Take 1,000 mcg by mouth daily.    . vitamin C (ASCORBIC ACID) 500 MG  tablet Take 500 mg by mouth daily.     No facility-administered medications prior to visit.    Allergies  Allergen Reactions  . Clindamycin/Lincomycin Diarrhea and Nausea And Vomiting  . Amoxicillin Rash    ROS Review of Systems  Constitutional: Negative.   HENT: Positive for congestion.   Eyes: Negative.   Respiratory: Negative.   Cardiovascular: Negative.   Gastrointestinal: Negative.   Endocrine: Negative.   Genitourinary:       Pressure sensation  Musculoskeletal: Positive for arthralgias.  Skin:       bruising  Allergic/Immunologic: Negative.   Neurological: Positive for dizziness, weakness and light-headedness.  Hematological: Negative.   Psychiatric/Behavioral: Negative.       Objective:    Physical Exam  Constitutional: She is oriented to person, place, and time. She appears well-developed and well-nourished. No distress.  HENT:  Head: Normocephalic and atraumatic.  Eyes: Conjunctivae are normal.  Cardiovascular: Normal rate, regular rhythm, normal heart sounds and intact distal pulses.  Pulmonary/Chest: Effort normal and breath sounds normal.  Musculoskeletal:     Cervical back: Normal range of motion and neck supple.  Neurological: She is oriented to person, place, and time.  Skin: She is not diaphoretic.  Psychiatric: She has a normal mood and affect. Her behavior is normal.    BP 130/70 (BP Location: Left Arm, Patient Position: Sitting, Cuff Size: Normal)   Pulse 91   Temp 98.6 F (37 C) (Oral)   Ht 5\' 5"  (1.651 m)   Wt 117 lb 9.6 oz (53.3 kg)   SpO2 97%   BMI 19.57 kg/m  Wt Readings from Last 3 Encounters:  02/20/19 117 lb 9.6 oz (53.3 kg)  01/23/19 119 lb 3.2 oz (54.1 kg)  10/15/15 138 lb (62.6 kg)     Health Maintenance Due  Topic Date Due  . TETANUS/TDAP  10/22/1945  . INFLUENZA VACCINE  09/23/2018   Lab Results  Component Value Date   TSH 2.760 12/12/2018   Lab Results  Component Value Date   WBC 7.0 12/12/2018   HGB 13.2  11/28/2017   HCT 44 (A) 12/12/2018   MCV 88 12/12/2018   PLT 172 11/28/2017   Lab Results  Component Value Date   NA 141 12/12/2018   K 5.3 12/12/2018   CO2 27 12/12/2018   GLUCOSE 128 (H) 10/15/2015   BUN 32 (A) 12/12/2018   CREATININE 0.98 12/12/2018   BILITOT 0.5 12/12/2018   ALKPHOS 88 12/12/2018   AST 26 12/12/2018   ALT 21 12/12/2018   PROT 7.0 12/12/2018   ALBUMIN 4.2 12/12/2018   CALCIUM 10.2 12/12/2018   ANIONGAP 7 10/15/2015   Lab Results  Component Value Date  CHOL 192 12/12/2018   Lab Results  Component Value Date   HDL 87 (A) 12/12/2018   Lab Results  Component Value Date   LDLCALC 91 12/12/2018   Lab Results  Component Value Date   TRIG 79 12/12/2018    Assessment & Plan:  1. Dizziness ecg due to concerns for afib in Nov-negative per Dr. Tawni Pummel denies CP/SOB-suggested holter monitor if repeat in dizziness or other concerns - Basic metabolic panel - POCT URINALYSIS DIP (CLINITEK)  2. Chronic kidney disease, unspecified CKD stage - Basic metabolic panel - POCT URINALYSIS DIP (CLINITEK) H/o of elevated K and abnormal renal function 3. Hyperkalemia - Basic metabolic panel Watching foods with K-less Potatoes and Bananas 4. Elevated glucose - Hemoglobin A1c No h/o DM 5. Leukocytes in urine - Urine Culture Follow-up:  Call for repeat in symptoms Eri Mcevers Hannah Beat, MD

## 2019-02-22 LAB — URINE CULTURE
MICRO NUMBER:: 1237735
Result:: NO GROWTH
SPECIMEN QUALITY:: ADEQUATE

## 2019-02-26 ENCOUNTER — Telehealth: Payer: Self-pay | Admitting: Family Medicine

## 2019-02-26 ENCOUNTER — Other Ambulatory Visit: Payer: Self-pay | Admitting: Family Medicine

## 2019-02-26 DIAGNOSIS — R42 Dizziness and giddiness: Secondary | ICD-10-CM

## 2019-02-26 NOTE — Telephone Encounter (Signed)
Patient is calling and states she is still not feeling well from her last visit. She states she is very dizzy and just wants to lay down with her eyes shut. She states her legs are still weak when she stands. Please advise.

## 2019-02-26 NOTE — Telephone Encounter (Signed)
Complete labwork. Cardiology referrral for evaluation

## 2019-02-26 NOTE — Telephone Encounter (Signed)
Routing to Dr. Corum 

## 2019-02-27 DIAGNOSIS — R7309 Other abnormal glucose: Secondary | ICD-10-CM | POA: Diagnosis not present

## 2019-02-27 DIAGNOSIS — R42 Dizziness and giddiness: Secondary | ICD-10-CM | POA: Diagnosis not present

## 2019-02-27 DIAGNOSIS — N189 Chronic kidney disease, unspecified: Secondary | ICD-10-CM | POA: Diagnosis not present

## 2019-02-27 DIAGNOSIS — E875 Hyperkalemia: Secondary | ICD-10-CM | POA: Diagnosis not present

## 2019-02-27 NOTE — Telephone Encounter (Signed)
Patient called back to check on her concerns from yesterday. I informed her of Dr. Holly Bodily note and gave her the number to cardiology. Patient seemed very confused on why she is needing to see a cardiologist.

## 2019-02-27 NOTE — Telephone Encounter (Signed)
Patient is aware of the results & recommendation

## 2019-02-27 NOTE — Progress Notes (Signed)
CARDIOLOGY CONSULT NOTE       Patient ID: Sharon Scott MRN: [id number] DOB/AGE: [id number] 84 y.o.  Admit date: (Not on file) Referring Physician: Corum Primary Physician: Maryruth Hancock, MD Primary Cardiologist: New Reason for Consultation: Dizziness  Active Problems:   * No active hospital problems. *   HPI:  86 y.o. with no previous cardiac history. History of hypothyroidism, HLD, CRF and osteoporosis.  Last week Has felt shaky and swimmy headed. Mild headache Knees/legs feel weak.  In office with primary not postural. She gets Upset about COVID concerns No fever dyspnea chest pain or palpitations UA negative Baseline Cr is around 1 despite diagnosis of CRF No history of anemia and Hct 44 October TSH 2.7 12/12/18   She has had improvement last week. Not postural in office today. Symptoms clearly sound like vertigo Anise Salvo with her today ECG is normal in our office today   ROS All other systems reviewed and negative except as noted above  Past Medical History:  Diagnosis Date  . Age-related osteoporosis without current pathological fracture   . Cancer ([place])    skin  . Chronic kidney disease, stage II (mild)   . Chronic kidney disease, unspecified   . Dysphagia, oropharyngeal phase   . Hyperlipidemia, unspecified   . Hypothyroidism, unspecified   . Irritable bowel syndrome with diarrhea   . OAB (overactive bladder)   . Shingles 1990  . Thyroid disease    hypothyroid  . Unspecified osteoarthritis, unspecified site     Family History  Problem Relation Age of Onset  . Healthy Mother   . Healthy Father   . Stroke Father     Social History   Socioeconomic History  . Marital status: Widowed    Spouse name: Not on file  . Number of children: Not on file  . Years of education: Not on file  . Highest education level: Not on file  Occupational History  . Occupation: retired  Tobacco Use  . Smoking status: Never Smoker  . Smokeless tobacco: Never Used   Substance and Sexual Activity  . Alcohol use: No  . Drug use: No  . Sexual activity: Not Currently  Other Topics Concern  . Not on file  Social History Narrative  . Not on file   Social Determinants of Health   Financial Resource Strain:   . Difficulty of Paying Living Expenses: Not on file  Food Insecurity:   . Worried About Charity fundraiser in the Last Year: Not on file  . Ran Out of Food in the Last Year: Not on file  Transportation Needs:   . Lack of Transportation (Medical): Not on file  . Lack of Transportation (Non-Medical): Not on file  Physical Activity:   . Days of Exercise per Week: Not on file  . Minutes of Exercise per Session: Not on file  Stress:   . Feeling of Stress : Not on file  Social Connections:   . Frequency of Communication with Friends and Family: Not on file  . Frequency of Social Gatherings with Friends and Family: Not on file  . Attends Religious Services: Not on file  . Active Member of Clubs or Organizations: Not on file  . Attends Archivist Meetings: Not on file  . Marital Status: Not on file  Intimate Partner Violence:   . Fear of Current or Ex-Partner: Not on file  . Emotionally Abused: Not on file  . Physically Abused: Not on file  .  Sexually Abused: Not on file    Past Surgical History:  Procedure Laterality Date  . ABDOMINAL HYSTERECTOMY  1968  . ANKLE SURGERY Right 2011   plate and 7 screws  . FRACTURE SURGERY          Physical Exam: Blood pressure (!) 156/71, pulse 89, temperature (!) 96.6 F (35.9 C), temperature source Temporal, height 5\' 5"  (1.651 m), weight 119 lb (54 kg), SpO2 99 %.    Affect appropriate Healthy:  appears stated age 58: normal Neck supple with no adenopathy JVP normal no bruits no thyromegaly Lungs clear with no wheezing and good diaphragmatic motion Heart:  S1/S2 no murmur, no rub, gallop or click PMI normal Abdomen: benighn, BS positve, no tenderness, no AAA no bruit.  No HSM  or HJR Distal pulses intact with no bruits No edema Neuro non-focal Skin warm and dry small ecchymosis on forehead  No muscular weakness   Labs:   Lab Results  Component Value Date   WBC 7.0 12/12/2018   HGB 13.2 11/28/2017   HCT 44 (A) 12/12/2018   MCV 88 12/12/2018   PLT 172 11/28/2017    Recent Labs  Lab 02/27/19 1125  NA 141  K 4.7  CL 104  CO2 30  BUN 29*  CREATININE 0.95*  CALCIUM 9.9  GLUCOSE 118   Lab Results  Component Value Date   CKTOTAL 131 12/22/2009   CKMB 2.5 12/22/2009   TROPONINI 0.05        NO INDICATION OF MYOCARDIAL INJURY. 12/22/2009    Lab Results  Component Value Date   CHOL 192 12/12/2018   CHOL 173 11/28/2017   Lab Results  Component Value Date   HDL 87 (A) 12/12/2018   HDL 76 (A) 11/28/2017   Lab Results  Component Value Date   LDLCALC 91 12/12/2018   LDLCALC 79 11/28/2017   Lab Results  Component Value Date   TRIG 79 12/12/2018   TRIG 92 11/28/2017   No results found for: CHOLHDL No results found for: LDLDIRECT    Radiology: No results found.  EKG: NSR normal ECG    ASSESSMENT AND PLAN:   1. Dizziness:  Seems more related to vertigo/inner ear. Primary can consider doing head CT I called in PRN Meclizine for her  2. Hypothyroid:  On replacement TSH has been normal  3. Osteoporosis:  Continue weight bearing Vitamin D and calcium   No need for cardiology f/u or further testing   Signed: Jenkins Rouge 02/28/2019, 10:16 AM

## 2019-02-27 NOTE — Telephone Encounter (Signed)
Dr. Luan Pulling was concerned about a potential change in heart rate and rhythm at pts last appt.  Sometimes dizziness can be associated with an abnormal rhythm

## 2019-02-28 ENCOUNTER — Encounter: Payer: Self-pay | Admitting: Cardiovascular Disease

## 2019-02-28 ENCOUNTER — Other Ambulatory Visit: Payer: Self-pay

## 2019-02-28 ENCOUNTER — Ambulatory Visit (INDEPENDENT_AMBULATORY_CARE_PROVIDER_SITE_OTHER): Payer: Medicare Other | Admitting: Cardiovascular Disease

## 2019-02-28 VITALS — BP 156/71 | HR 89 | Temp 96.6°F | Ht 65.0 in | Wt 119.0 lb

## 2019-02-28 DIAGNOSIS — R42 Dizziness and giddiness: Secondary | ICD-10-CM | POA: Diagnosis not present

## 2019-02-28 LAB — BASIC METABOLIC PANEL
BUN/Creatinine Ratio: 31 (calc) — ABNORMAL HIGH (ref 6–22)
BUN: 29 mg/dL — ABNORMAL HIGH (ref 7–25)
CO2: 30 mmol/L (ref 20–32)
Calcium: 9.9 mg/dL (ref 8.6–10.4)
Chloride: 104 mmol/L (ref 98–110)
Creat: 0.95 mg/dL — ABNORMAL HIGH (ref 0.60–0.88)
Glucose, Bld: 118 mg/dL (ref 65–139)
Potassium: 4.7 mmol/L (ref 3.5–5.3)
Sodium: 141 mmol/L (ref 135–146)

## 2019-02-28 LAB — HEMOGLOBIN A1C
Hgb A1c MFr Bld: 5.9 % of total Hgb — ABNORMAL HIGH (ref <5.7)
Mean Plasma Glucose: 123 (calc)
eAG (mmol/L): 6.8 (calc)

## 2019-02-28 MED ORDER — MECLIZINE HCL 25 MG PO TABS: 25.0000 mg | ORAL_TABLET | Freq: Three times a day (TID) | ORAL | 0 refills | Status: DC | PRN

## 2019-02-28 NOTE — Patient Instructions (Signed)
Medication Instructions:  Your physician recommends that you continue on your current medications as directed. Please refer to the Current Medication list given to you today.   *If you need a refill on your cardiac medications before your next appointment, please call your pharmacy*  Lab Work: NONE   If you have labs (blood work) drawn today and your tests are completely normal, you will receive your results only by: Marland Kitchen MyChart Message (if you have MyChart) OR . A paper copy in the mail If you have any lab test that is abnormal or we need to change your treatment, we will call you to review the results.  Testing/Procedures: NONE   Follow-Up: At [facility], you and your health needs are our priority.  As part of our continuing mission to provide you with exceptional heart care, we have created designated Provider Care Teams.  These Care Teams include your primary Cardiologist (physician) and Advanced Practice Providers (APPs -  Physician Assistants and Nurse Practitioners) who all work together to provide you with the care you need, when you need it.  Your next appointment:    As Needed   The format for your next appointment:   Either In Person or Virtual  Provider:   Jenkins Rouge, MD  Other Instructions Thank you for choosing [facility]!

## 2019-03-13 ENCOUNTER — Other Ambulatory Visit: Payer: Self-pay | Admitting: Cardiovascular Disease

## 2019-03-13 MED ORDER — MECLIZINE HCL 25 MG PO TABS: 25.0000 mg | ORAL_TABLET | Freq: Three times a day (TID) | ORAL | 3 refills | Status: AC | PRN

## 2019-03-13 NOTE — Telephone Encounter (Signed)
Needing refill on meclizine (ANTIVERT) 25 MG tablet [id number] To [facility] in [place]   90 day supply

## 2019-03-13 NOTE — Telephone Encounter (Signed)
Refilled antivert

## 2019-04-05 DIAGNOSIS — Z23 Encounter for immunization: Secondary | ICD-10-CM | POA: Diagnosis not present

## 2019-05-04 DIAGNOSIS — Z23 Encounter for immunization: Secondary | ICD-10-CM | POA: Diagnosis not present

## 2019-05-07 DIAGNOSIS — H524 Presbyopia: Secondary | ICD-10-CM | POA: Diagnosis not present

## 2019-05-07 DIAGNOSIS — H43813 Vitreous degeneration, bilateral: Secondary | ICD-10-CM | POA: Diagnosis not present

## 2019-06-06 DIAGNOSIS — B351 Tinea unguium: Secondary | ICD-10-CM | POA: Diagnosis not present

## 2019-06-06 DIAGNOSIS — I739 Peripheral vascular disease, unspecified: Secondary | ICD-10-CM | POA: Diagnosis not present

## 2019-06-26 DIAGNOSIS — H35371 Puckering of macula, right eye: Secondary | ICD-10-CM | POA: Diagnosis not present

## 2019-06-26 DIAGNOSIS — Z961 Presence of intraocular lens: Secondary | ICD-10-CM | POA: Diagnosis not present

## 2019-06-26 DIAGNOSIS — H16223 Keratoconjunctivitis sicca, not specified as Sjogren's, bilateral: Secondary | ICD-10-CM | POA: Diagnosis not present

## 2019-06-26 DIAGNOSIS — H04123 Dry eye syndrome of bilateral lacrimal glands: Secondary | ICD-10-CM | POA: Diagnosis not present

## 2019-06-26 DIAGNOSIS — Z9849 Cataract extraction status, unspecified eye: Secondary | ICD-10-CM | POA: Diagnosis not present

## 2019-06-26 DIAGNOSIS — H43813 Vitreous degeneration, bilateral: Secondary | ICD-10-CM | POA: Diagnosis not present

## 2019-06-26 DIAGNOSIS — H4321 Crystalline deposits in vitreous body, right eye: Secondary | ICD-10-CM | POA: Diagnosis not present

## 2019-06-26 DIAGNOSIS — H35372 Puckering of macula, left eye: Secondary | ICD-10-CM | POA: Diagnosis not present

## 2019-07-06 DIAGNOSIS — E039 Hypothyroidism, unspecified: Secondary | ICD-10-CM | POA: Diagnosis not present

## 2019-07-06 DIAGNOSIS — D519 Vitamin B12 deficiency anemia, unspecified: Secondary | ICD-10-CM | POA: Diagnosis not present

## 2019-07-06 DIAGNOSIS — Z0000 Encounter for general adult medical examination without abnormal findings: Secondary | ICD-10-CM | POA: Diagnosis not present

## 2019-07-24 ENCOUNTER — Ambulatory Visit: Payer: Medicare Other | Admitting: Family Medicine

## 2019-07-30 ENCOUNTER — Ambulatory Visit (INDEPENDENT_AMBULATORY_CARE_PROVIDER_SITE_OTHER): Payer: Medicare Other | Admitting: Dermatology

## 2019-07-30 ENCOUNTER — Other Ambulatory Visit: Payer: Self-pay

## 2019-07-30 ENCOUNTER — Encounter: Payer: Self-pay | Admitting: Dermatology

## 2019-07-30 DIAGNOSIS — L820 Inflamed seborrheic keratosis: Secondary | ICD-10-CM | POA: Diagnosis not present

## 2019-07-30 DIAGNOSIS — L570 Actinic keratosis: Secondary | ICD-10-CM | POA: Diagnosis not present

## 2019-07-30 DIAGNOSIS — Z85828 Personal history of other malignant neoplasm of skin: Secondary | ICD-10-CM

## 2019-07-30 DIAGNOSIS — D225 Melanocytic nevi of trunk: Secondary | ICD-10-CM

## 2019-07-30 DIAGNOSIS — Z1283 Encounter for screening for malignant neoplasm of skin: Secondary | ICD-10-CM | POA: Diagnosis not present

## 2019-07-30 DIAGNOSIS — D485 Neoplasm of uncertain behavior of skin: Secondary | ICD-10-CM

## 2019-07-30 DIAGNOSIS — D229 Melanocytic nevi, unspecified: Secondary | ICD-10-CM

## 2019-08-06 ENCOUNTER — Encounter: Payer: Self-pay | Admitting: Dermatology

## 2019-08-06 NOTE — Progress Notes (Signed)
   Follow-Up Visit   Subjective  Sharon Scott is a 86 y.o. female who presents for the following: Annual Exam (left post auricular- 5 months- no itch "just rough").  Crusts Location: Left neck Duration:  Quality:  Associated Signs/Symptoms: Modifying Factors:  Severity:  Timing: Context: History of multiple nonmelanoma skin cancers  The following portions of the chart were reviewed this encounter and updated as appropriate: Tobacco  Allergies  Meds  Problems  Med Hx  Surg Hx  Fam Hx      Objective  Well appearing patient in no apparent distress; mood and affect are within normal limits.  A full examination was performed including scalp, head, eyes, ears, nose, lips, neck, chest, axillae, abdomen, back, buttocks, bilateral upper extremities, bilateral lower extremities, hands, feet, fingers, toes, fingernails, and toenails. All findings within normal limits unless otherwise noted below.   Assessment & Plan  Neoplasm of uncertain behavior of skin  Other Related Procedures Skin / nail biopsy  AK (actinic keratosis) (2) Left Posterior Neck  Destruction of lesion - Left Posterior Neck  Destruction method: cryotherapy   Informed consent: discussed and consent obtained   Timeout:  patient name, date of birth, surgical site, and procedure verified Lesion destroyed using liquid nitrogen: Yes   Region frozen until ice ball extended beyond lesion: Yes   Cryotherapy cycles:  5 Outcome: patient tolerated procedure well with no complications    Nevus Mid Back  Annual skin examination  Personal history of skin cancer Head - Anterior (Face)  Inflamed seborrheic keratosis Mid Occipital Scalp  If this grows or bleeds, return for biopsy to rule out carcinoma in situ Skin cancer screening performed today.

## 2019-12-31 ENCOUNTER — Encounter (HOSPITAL_COMMUNITY): Payer: Self-pay | Admitting: Emergency Medicine

## 2019-12-31 ENCOUNTER — Encounter: Payer: Self-pay | Admitting: Emergency Medicine

## 2019-12-31 ENCOUNTER — Emergency Department (HOSPITAL_COMMUNITY)
Admission: EM | Admit: 2019-12-31 | Discharge: 2019-12-31 | Disposition: A | Discharge status: Home / Self Care | Payer: Medicare Other | Attending: Emergency Medicine | Admitting: Emergency Medicine

## 2019-12-31 ENCOUNTER — Other Ambulatory Visit: Payer: Self-pay

## 2019-12-31 ENCOUNTER — Emergency Department (HOSPITAL_COMMUNITY): Payer: Medicare Other

## 2019-12-31 ENCOUNTER — Ambulatory Visit
Admission: EM | Admit: 2019-12-31 | Discharge: 2019-12-31 | Disposition: A | Discharge status: Home / Self Care | Payer: Medicare Other

## 2019-12-31 DIAGNOSIS — Y92532 Urgent care center as the place of occurrence of the external cause: Secondary | ICD-10-CM | POA: Insufficient documentation

## 2019-12-31 DIAGNOSIS — Y9389 Activity, other specified: Secondary | ICD-10-CM | POA: Insufficient documentation

## 2019-12-31 DIAGNOSIS — N182 Chronic kidney disease, stage 2 (mild): Secondary | ICD-10-CM | POA: Diagnosis not present

## 2019-12-31 DIAGNOSIS — S80811A Abrasion, right lower leg, initial encounter: Secondary | ICD-10-CM | POA: Insufficient documentation

## 2019-12-31 DIAGNOSIS — R9431 Abnormal electrocardiogram [ECG] [EKG]: Secondary | ICD-10-CM | POA: Diagnosis not present

## 2019-12-31 DIAGNOSIS — R600 Localized edema: Secondary | ICD-10-CM | POA: Insufficient documentation

## 2019-12-31 DIAGNOSIS — E039 Hypothyroidism, unspecified: Secondary | ICD-10-CM | POA: Diagnosis not present

## 2019-12-31 DIAGNOSIS — S81811A Laceration without foreign body, right lower leg, initial encounter: Secondary | ICD-10-CM

## 2019-12-31 DIAGNOSIS — J449 Chronic obstructive pulmonary disease, unspecified: Secondary | ICD-10-CM | POA: Diagnosis not present

## 2019-12-31 DIAGNOSIS — W228XXA Striking against or struck by other objects, initial encounter: Secondary | ICD-10-CM | POA: Diagnosis not present

## 2019-12-31 DIAGNOSIS — Z23 Encounter for immunization: Secondary | ICD-10-CM | POA: Insufficient documentation

## 2019-12-31 DIAGNOSIS — Y999 Unspecified external cause status: Secondary | ICD-10-CM | POA: Diagnosis not present

## 2019-12-31 DIAGNOSIS — L819 Disorder of pigmentation, unspecified: Secondary | ICD-10-CM

## 2019-12-31 DIAGNOSIS — R2243 Localized swelling, mass and lump, lower limb, bilateral: Secondary | ICD-10-CM | POA: Diagnosis present

## 2019-12-31 DIAGNOSIS — Z79899 Other long term (current) drug therapy: Secondary | ICD-10-CM | POA: Diagnosis not present

## 2019-12-31 DIAGNOSIS — R609 Edema, unspecified: Secondary | ICD-10-CM

## 2019-12-31 LAB — COMPREHENSIVE METABOLIC PANEL
ALT: 26 U/L (ref 0–44)
AST: 28 U/L (ref 15–41)
Albumin: 4.2 g/dL (ref 3.5–5.0)
Alkaline Phosphatase: 78 U/L (ref 38–126)
Anion gap: 8 (ref 5–15)
BUN: 33 mg/dL — ABNORMAL HIGH (ref 8–23)
CO2: 29 mmol/L (ref 22–32)
Calcium: 9.6 mg/dL (ref 8.9–10.3)
Chloride: 101 mmol/L (ref 98–111)
Creatinine, Ser: 0.95 mg/dL (ref 0.44–1.00)
GFR, Estimated: 56 mL/min — ABNORMAL LOW (ref >60)
Glucose, Bld: 93 mg/dL (ref 70–99)
Potassium: 4.6 mmol/L (ref 3.5–5.1)
Sodium: 138 mmol/L (ref 135–145)
Total Bilirubin: 0.7 mg/dL (ref 0.3–1.2)
Total Protein: 7.3 g/dL (ref 6.5–8.1)

## 2019-12-31 LAB — CBC
HCT: 43.5 % (ref 36.0–46.0)
Hemoglobin: 13.1 g/dL (ref 12.0–15.0)
MCH: 28.2 pg (ref 26.0–34.0)
MCHC: 30.1 g/dL (ref 30.0–36.0)
MCV: 93.5 fL (ref 80.0–100.0)
Platelets: 203 10*3/uL (ref 150–400)
RBC: 4.65 MIL/uL (ref 3.87–5.11)
RDW: 13.2 % (ref 11.5–15.5)
WBC: 7.1 10*3/uL (ref 4.0–10.5)
nRBC: 0 % (ref 0.0–0.2)

## 2019-12-31 LAB — BRAIN NATRIURETIC PEPTIDE: B Natriuretic Peptide: 154 pg/mL — ABNORMAL HIGH (ref 0.0–100.0)

## 2019-12-31 IMAGING — DX DG CHEST 2V
2 series · 2 of 2 positions shown · non-contrast
Comparison: [DATE]

CLINICAL DATA: Lower extremity edema

EXAM:
CHEST - 2 VIEW

[chest pa]
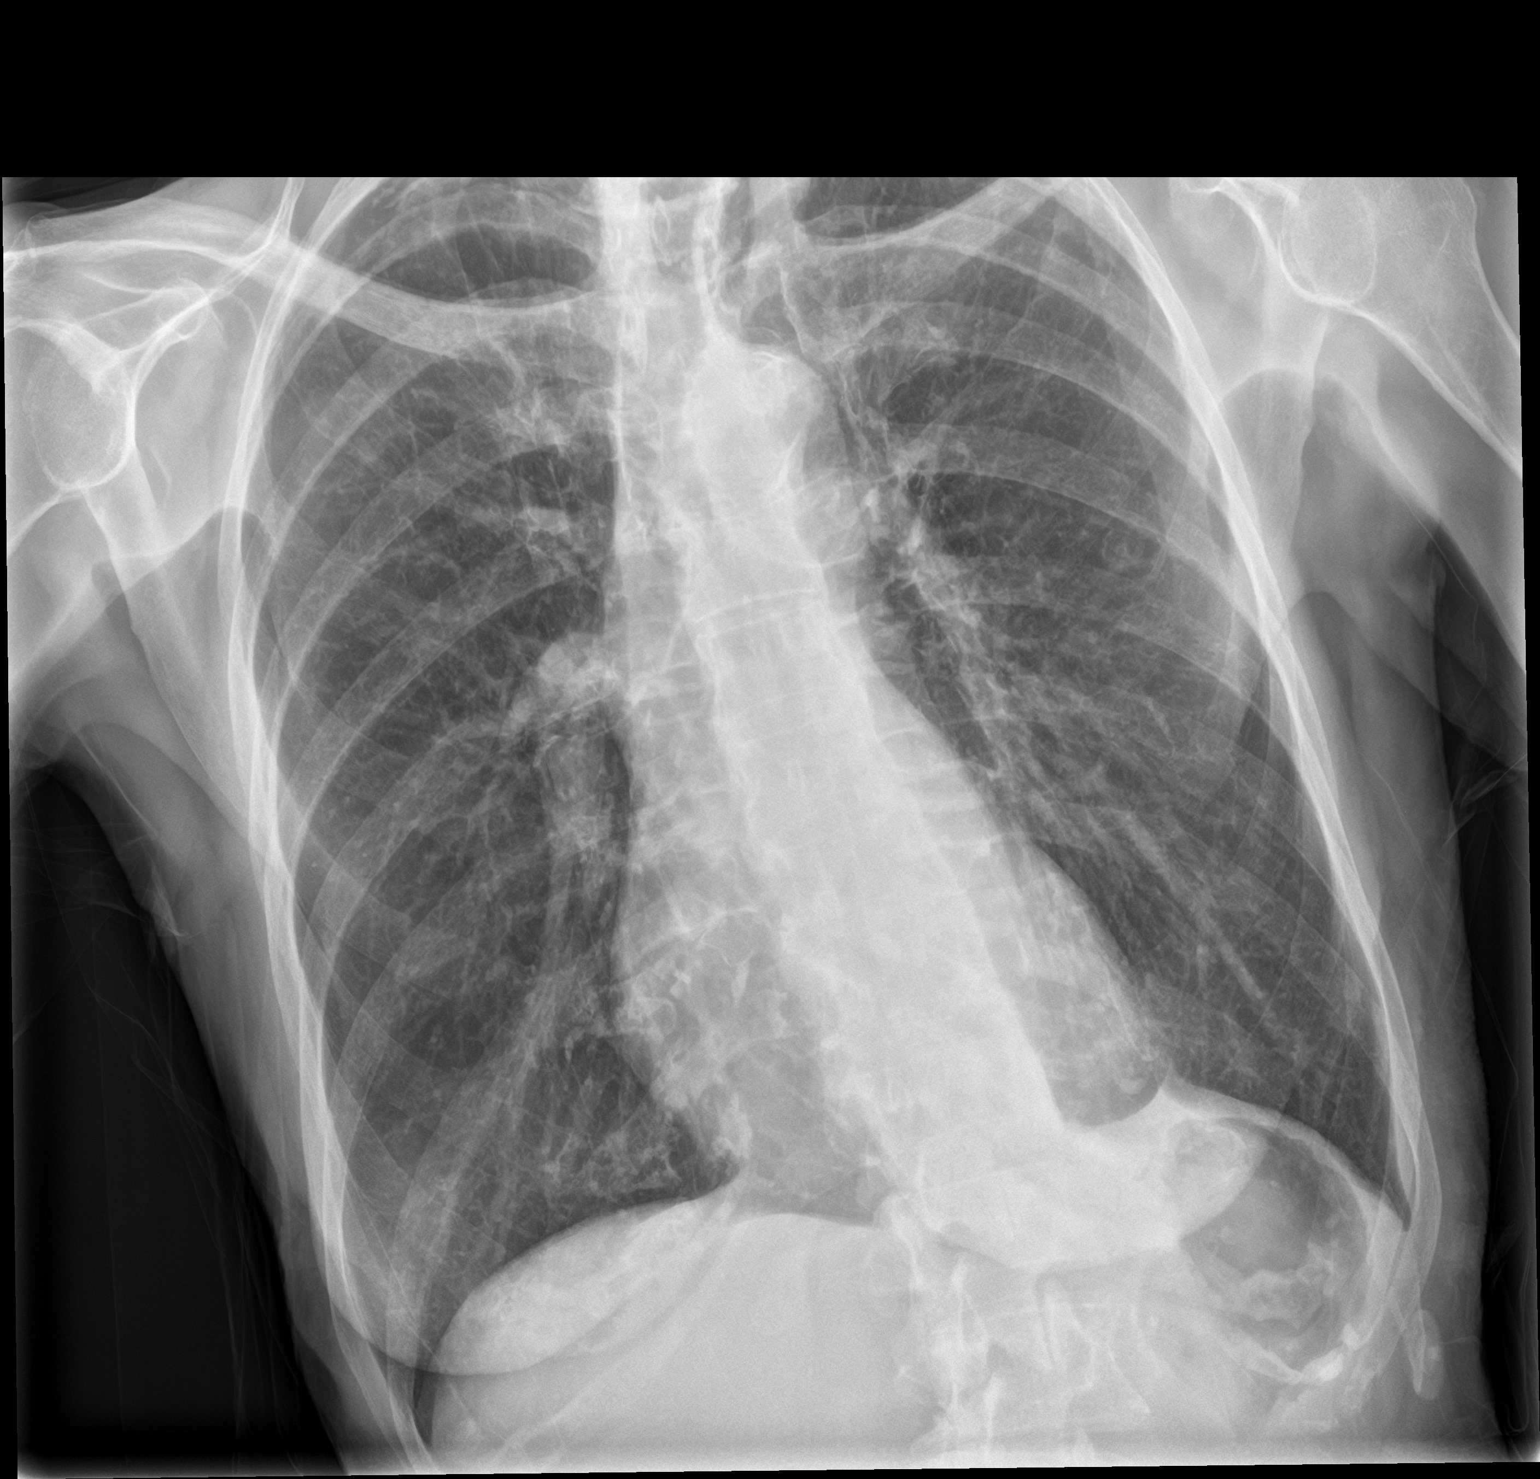

[chest lat]
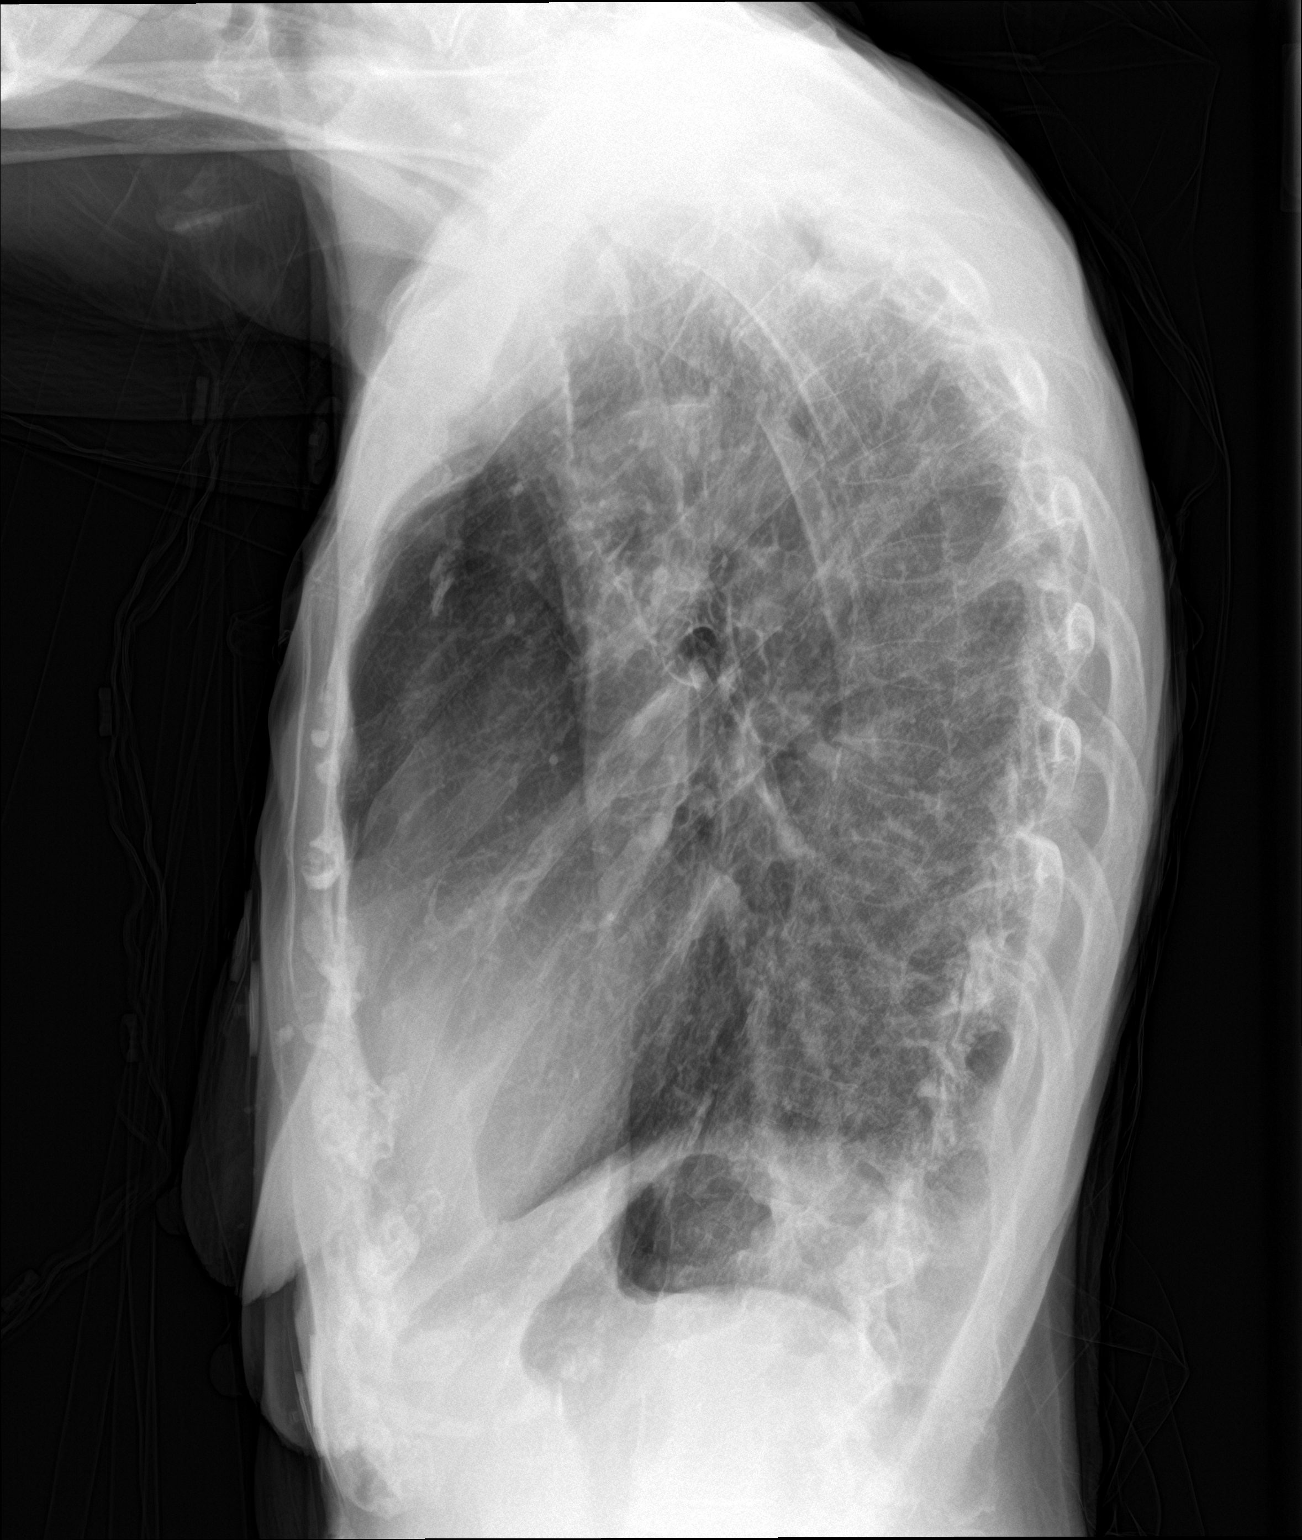

[2 of 2 positions shown; findings below may reference images not displayed]

FINDINGS: Stable cardiomediastinal contours. Atherosclerotic calcification of
the aortic knob. Hyperexpanded lungs with coarsened interstitial
markings bilaterally. Right apical pleuroparenchymal scarring. No
focal airspace consolidation. No pleural effusion or pneumothorax.
IMPRESSION: COPD without acute cardiopulmonary process.

## 2019-12-31 MED ORDER — TETANUS-DIPHTH-ACELL PERTUSSIS 5-2.5-18.5 LF-MCG/0.5 IM SUSY
0.5000 mL | PREFILLED_SYRINGE | Freq: Once | INTRAMUSCULAR | Status: AC
  Administered 2019-12-31: 0.5 mL via INTRAMUSCULAR
  Filled 2019-12-31: qty 0.5

## 2019-12-31 NOTE — ED Notes (Signed)
Patient is being discharged from the Urgent Care and sent to the Emergency Department via pov . Per b wurst, patient is in need of higher level of care due to weeping edema to bilateral lower leg extremity. Patient is aware and verbalizes understanding of plan of care.  Vitals:   12/31/19 1047  BP: 131/72  Pulse: 92  Resp: 17  Temp: 98.1 F (36.7 C)  SpO2: 96%

## 2019-12-31 NOTE — Discharge Instructions (Addendum)
Recommending further evaluation and management in the ED for bilateral LE swelling and drainage.  Cannot rule out infectious cause, blood clot, kidney or heart dysfunction in urgent care setting.  Patient and caretaker aware.  Will travel by private vehicle to ED.

## 2019-12-31 NOTE — ED Provider Notes (Signed)
[facility] EMERGENCY DEPARTMENT Provider Note   CSN: [id number] Arrival date & time: 12/31/19  1205     History Chief Complaint  Patient presents with  . Leg Swelling    Sharon Scott is a 86 y.o. female.  HPI She presents for evaluation of leg swelling for 1 week, with drainage from the lower legs that started in the last day or so.  She has been unable to sleep, and in bed for the last 2 weeks because she has been caring for her son, and is sleeping in a chair.  Today she went to an urgent care, for evaluation.  She thought she needed to get some fluid pills.  She was evaluated there, and during that evaluation, her right lower leg was injured, when a leg shelf was pulled out so she could lie supine.  This wound was treated with Xeroform gauze, and saline wet-to-dry dressing.  The patient was sent to the ED for more comprehensive evaluation.  Patient has been eating well.  She denies fever, shortness of breath, chest pain, weakness or dizziness.  There are no other known modifying factors.    Past Medical History:  Diagnosis Date  . Age-related osteoporosis without current pathological fracture   . BCC (basal cell carcinoma of skin) 10/11/1996   Left Lower Back (curet and 5FU)  . BCC (basal cell carcinoma of skin) 01/15/1997   Right Nasal Tip (curet and 5FU)  . BCC (basal cell carcinoma of skin) 08/02/2007   Crown of Scalp (MOH's)  . BCC (basal cell carcinoma of skin) 07/27/2011   Left Preauricular (curet and 5FU)  . Cancer ([place])    skin  . Chronic kidney disease, stage II (mild)   . Chronic kidney disease, unspecified   . Dysphagia, oropharyngeal phase   . Hyperlipidemia, unspecified   . Hypothyroidism, unspecified   . Irritable bowel syndrome with diarrhea   . Nodular basal cell carcinoma (BCC) 05/20/2015   Right Tip Nose (MOH's)  . OAB (overactive bladder)   . SCCA (squamous cell carcinoma) of skin 08/02/2007   Right Arm Lateral (curet and 5FU)  . SCCA (squamous cell  carcinoma) of skin 08/02/2007   Right Arm Medial (curet and 5FU)  . SCCA (squamous cell carcinoma) of skin 08/02/2007   Right Thigh (curet and 5FU)  . SCCA (squamous cell carcinoma) of skin 03/06/2013   Left Forearm (in situ) (tx p bx)  . SCCA (squamous cell carcinoma) of skin 03/26/2014   Right Bicep (in situ) (tx p bx)  . SCCA (squamous cell carcinoma) of skin 12/16/2015   Left Mid Back (Bowen's) (tx p bx)  . SCCA (squamous cell carcinoma) of skin 03/30/2016   Right Hand (in situ) (tx p bx)  . Shingles 1990  . Superficial basal cell carcinoma (BCC) 09/29/2016   Left Outer Forehead (curet and 5FU)  . Superficial nodular basal cell carcinoma (BCC) 08/03/2017   Left Sideburn (MOH's)  . Thyroid disease    hypothyroid  . Unspecified osteoarthritis, unspecified site     Patient Active Problem List   Diagnosis Date Noted  . Dizziness 02/20/2019  . Hyperkalemia 02/20/2019  . Elevated glucose 02/20/2019  . Leukocytes in urine 02/20/2019  . Age-related osteoporosis without current pathological fracture   . Hyperlipidemia, unspecified   . Hypothyroidism, unspecified   . Chronic kidney disease, stage II (mild)   . Chronic kidney disease, unspecified   . Dysphagia, oropharyngeal phase   . OAB (overactive bladder)   . Unspecified osteoarthritis,  unspecified site   . CLOSED BIMALLEOLAR FRACTURE 12/31/2009    Past Surgical History:  Procedure Laterality Date  . ABDOMINAL HYSTERECTOMY  1968  . ANKLE SURGERY Right 2011   plate and 7 screws  . FRACTURE SURGERY       OB History   No obstetric history on file.     Family History  Problem Relation Age of Onset  . Healthy Mother   . Healthy Father   . Stroke Father     Social History   Tobacco Use  . Smoking status: Never Smoker  . Smokeless tobacco: Never Used  Vaping Use  . Vaping Use: Never used  Substance Use Topics  . Alcohol use: No  . Drug use: No    Home Medications Prior to Admission medications    Medication Sig Start Date End Date Taking? Authorizing Provider  calcium citrate (CALCITRATE - DOSED IN MG ELEMENTAL CALCIUM) 950 MG tablet Take 600 mg by mouth daily.    [provider]  cholecalciferol (VITAMIN D) 1000 UNITS tablet Take 1,000 Units by mouth daily.    [provider]  fluticasone (FLONASE) 50 MCG/ACT nasal spray Place 2 sprays into both nostrils daily. 12/19/18   [provider]  meclizine (ANTIVERT) 25 MG tablet Take 1 tablet (25 mg total) by mouth 3 (three) times daily as needed for dizziness. 03/13/19   Josue Hector, MD  Multiple Vitamins-Minerals (MULTIVITAMIN WITH MINERALS) tablet Take 1 tablet by mouth daily.    [provider]  niacinamide 500 MG tablet Take 500 mg by mouth 2 (two) times daily with a meal.    [provider]  SYNTHROID 50 MCG tablet Take 50 mcg by mouth daily. 09/18/15   [provider]  vitamin B-12 (CYANOCOBALAMIN) 1000 MCG tablet Take 1,000 mcg by mouth daily.    [provider]  vitamin C (ASCORBIC ACID) 500 MG tablet Take 500 mg by mouth daily.    [provider]    Allergies    Clindamycin/lincomycin and Amoxicillin  Review of Systems   Review of Systems  All other systems reviewed and are negative.   Physical Exam Updated Vital Signs BP 134/71 (BP Location: Right Arm)   Pulse (!) 104   Temp 97.8 F (36.6 C) (Oral)   Resp 20   Ht 5' 5.5" (1.664 m)   Wt 54 kg   SpO2 97%   BMI 19.50 kg/m   Physical Exam Vitals and nursing note reviewed.  Constitutional:      General: She is not in acute distress.    Appearance: She is well-developed. She is not ill-appearing, toxic-appearing or diaphoretic.  HENT:     Head: Normocephalic and atraumatic.     Right Ear: External ear normal.     Left Ear: External ear normal.  Eyes:     Conjunctiva/sclera: Conjunctivae normal.     Pupils: Pupils are equal, round, and reactive to light.  Neck:     Trachea: Phonation  normal.  Cardiovascular:     Rate and Rhythm: Normal rate and regular rhythm.     Heart sounds: Normal heart sounds.  Pulmonary:     Effort: Pulmonary effort is normal.     Breath sounds: Normal breath sounds.  Abdominal:     Palpations: Abdomen is soft.     Tenderness: There is no abdominal tenderness.  Musculoskeletal:        General: Normal range of motion.     Cervical back: Normal range of  motion and neck supple.     Comments: Swelling bilateral lower legs, left greater than right.  Her legs are erythematous, associated with the areas of swelling, and there are few areas of drainage of clear liquid.  There is a wound of the posterior calf that appears to be skin splitting, with a distal based flap.  This wound does not appear amenable to suture repair.  See representative images.  Skin:    General: Skin is warm and dry.  Neurological:     Mental Status: She is alert and oriented to person, place, and time.     Cranial Nerves: No cranial nerve deficit.     Sensory: No sensory deficit.     Motor: No abnormal muscle tone.     Coordination: Coordination normal.  Psychiatric:        Mood and Affect: Mood normal.        Behavior: Behavior normal.        Thought Content: Thought content normal.        Judgment: Judgment normal.         ED Results / Procedures / Treatments   Labs (all labs ordered are listed, but only abnormal results are displayed) Labs Reviewed  COMPREHENSIVE METABOLIC PANEL - Abnormal; Notable for the following components:      Result Value   BUN 33 (*)    GFR, Estimated 56 (*)    All other components within normal limits  BRAIN NATRIURETIC PEPTIDE - Abnormal; Notable for the following components:   B Natriuretic Peptide 154.0 (*)    All other components within normal limits  CBC    EKG None  Radiology DG Chest 2 View  Result Date: 12/31/2019 CLINICAL DATA:  Lower extremity edema EXAM: CHEST - 2 VIEW COMPARISON:  08/10/2017 FINDINGS: Stable  cardiomediastinal contours. Atherosclerotic calcification of the aortic knob. Hyperexpanded lungs with coarsened interstitial markings bilaterally. Right apical pleuroparenchymal scarring. No focal airspace consolidation. No pleural effusion or pneumothorax. IMPRESSION: COPD without acute cardiopulmonary process. Electronically Signed   By: Davina Poke D.O.   On: 12/31/2019 13:04    Procedures Procedures (including critical care time)  Medications Ordered in ED Medications - No data to display  ED Course  I have reviewed the triage vital signs and the nursing notes.  Pertinent labs & imaging results that were available during my care of the patient were reviewed by me and considered in my medical decision making (see chart for details).  Clinical Course as of Dec 30 2000  Mon Dec 31, 2019  2000 Mild elevation  Brain natriuretic peptide(!) [EW]  2000 Normal  CBC [EW]  2000 Normal except BUN elevated, GFR slightly low  Comprehensive metabolic panel(!) [EW]  7106 No CHF or infiltrate  DG Chest 2 View [EW]    Clinical Course User Index [EW] Daleen Bo, MD   MDM Rules/Calculators/A&P                           Patient Vitals for the past 24 hrs:  BP Temp Temp src Pulse Resp SpO2 Height Weight  12/31/19 1930 (!) 144/73 -- -- 93 20 100 % -- --  12/31/19 1627 134/71 97.8 F (36.6 C) Oral (!) 104 20 97 % -- --  12/31/19 1241 -- -- -- -- -- -- 5' 5.5" (1.664 m) 54 kg  12/31/19 1239 (!) 156/79 97.9 F (36.6 C) Oral 95 20 97 % -- --    At  discharge reevaluation with update and discussion. After initial assessment and treatment, an updated evaluation reveals she remains comfortable.  Findings discussed and questions answered. Daleen Bo   Medical Decision Making:  This patient is presenting for evaluation of leg swelling, which does require a range of treatment options, and is a complaint that involves a moderate risk of morbidity and mortality. The differential diagnoses  include cellulitis, peripheral edema, DVT. I decided to review old records, and in summary elderly female who has been no significant for 1 week because she is caring for her son.  Gradual onset of lower leg swelling bilaterally.  No pulmonary symptoms..  I do not require additional historical information from anyone.  Clinical Laboratory Tests Ordered, included CBC and Metabolic panel. Review indicates normal, mild elevation of BUN 20. Radiologic Tests Ordered, included chest x-ray.  I independently Visualized: Radiographic images, which show no infiltrate or edema    Critical Interventions-clinical evaluation, laboratory testing, chest x-ray, observation, wound care by nurse, reevaluation  After These Interventions, the Patient was reevaluated and was found stable for discharge.  Peripheral edema without significant signs or symptoms of cellulitis.  Doubt DVT with bilateral symptoms and clinical recliner.  Doubt heart failure.  Incidental to right calf region, which is superficial.  Patient instructed on wound care and treatment for peripheral edema, by me.  Recommended she start compression stockings as soon as possible left medial leg, right when the wound heals better.  CRITICAL CARE-no Performed by: Daleen Bo  Nursing Notes Reviewed/ Care Coordinated Applicable Imaging Reviewed Interpretation of Laboratory Data incorporated into ED treatment  The patient appears reasonably screened and/or stabilized for discharge and I doubt any other medical condition or other [facility] requiring further screening, evaluation, or treatment in the ED at this time prior to discharge.  Plan: Home Medications-continue medications; Home Treatments-elevate as much as possible, start compression stockings as soon as possible; return here if the recommended treatment, does not improve the symptoms; Recommended follow up-PCP, checkup in 1 week.     Final Clinical Impression(s) / ED Diagnoses Final diagnoses:   Peripheral edema  Abrasion, right lower leg, initial encounter    Rx / DC Orders ED Discharge Orders    None       Daleen Bo, MD 01/01/20 1627

## 2019-12-31 NOTE — ED Triage Notes (Addendum)
Edema to bilateral lower extremities x 1 week. Pt states she has been taking care of her son and has been on her feet a lot. Reports last night she a lot of weeping to both legs the LT more that the RT. Has appt with pcp next week.

## 2019-12-31 NOTE — ED Provider Notes (Signed)
[place]   [id number] 12/31/19 Arrival Time: 9937  CC: Bilateral LE swelling  SUBJECTIVE: History from: patient. Sharon Scott is a 86 y.o. female complains of bilateral LE swelling and drainage x 1 week.  States she has been on her feet more often caring for her son who is homebound.  Localizes symptoms to bilateral LEs.  Has tried elevating legs without relief.  Symptoms are made worse to the touch.  Denies similar symptoms in the past. Reports LLE with redness and more pronounced swelling.   Denies fever, chills, CP, SOB, cough, abdominal pain, changes in bowel or bladder habits.    ROS: As per HPI.  All other pertinent ROS negative.     Past Medical History:  Diagnosis Date  . Age-related osteoporosis without current pathological fracture   . BCC (basal cell carcinoma of skin) 10/11/1996   Left Lower Back (curet and 5FU)  . BCC (basal cell carcinoma of skin) 01/15/1997   Right Nasal Tip (curet and 5FU)  . BCC (basal cell carcinoma of skin) 08/02/2007   Crown of Scalp (MOH's)  . BCC (basal cell carcinoma of skin) 07/27/2011   Left Preauricular (curet and 5FU)  . Cancer ([place])    skin  . Chronic kidney disease, stage II (mild)   . Chronic kidney disease, unspecified   . Dysphagia, oropharyngeal phase   . Hyperlipidemia, unspecified   . Hypothyroidism, unspecified   . Irritable bowel syndrome with diarrhea   . Nodular basal cell carcinoma (BCC) 05/20/2015   Right Tip Nose (MOH's)  . OAB (overactive bladder)   . SCCA (squamous cell carcinoma) of skin 08/02/2007   Right Arm Lateral (curet and 5FU)  . SCCA (squamous cell carcinoma) of skin 08/02/2007   Right Arm Medial (curet and 5FU)  . SCCA (squamous cell carcinoma) of skin 08/02/2007   Right Thigh (curet and 5FU)  . SCCA (squamous cell carcinoma) of skin 03/06/2013   Left Forearm (in situ) (tx p bx)  . SCCA (squamous cell carcinoma) of skin 03/26/2014   Right Bicep (in situ) (tx p bx)  . SCCA (squamous cell  carcinoma) of skin 12/16/2015   Left Mid Back (Bowen's) (tx p bx)  . SCCA (squamous cell carcinoma) of skin 03/30/2016   Right Hand (in situ) (tx p bx)  . Shingles 1990  . Superficial basal cell carcinoma (BCC) 09/29/2016   Left Outer Forehead (curet and 5FU)  . Superficial nodular basal cell carcinoma (BCC) 08/03/2017   Left Sideburn (MOH's)  . Thyroid disease    hypothyroid  . Unspecified osteoarthritis, unspecified site    Past Surgical History:  Procedure Laterality Date  . ABDOMINAL HYSTERECTOMY  1968  . ANKLE SURGERY Right 2011   plate and 7 screws  . FRACTURE SURGERY     Allergies  Allergen Reactions  . Clindamycin/Lincomycin Diarrhea and Nausea And Vomiting  . Amoxicillin Rash   No current facility-administered medications on file prior to encounter.   Current Outpatient Medications on File Prior to Encounter  Medication Sig Dispense Refill  . calcium citrate (CALCITRATE - DOSED IN MG ELEMENTAL CALCIUM) 950 MG tablet Take 600 mg by mouth daily.    . cholecalciferol (VITAMIN D) 1000 UNITS tablet Take 1,000 Units by mouth daily.    . fluticasone (FLONASE) 50 MCG/ACT nasal spray Place 2 sprays into both nostrils daily.    . meclizine (ANTIVERT) 25 MG tablet Take 1 tablet (25 mg total) by mouth 3 (three) times daily as needed for dizziness. [place]  tablet 3  . Multiple Vitamins-Minerals (MULTIVITAMIN WITH MINERALS) tablet Take 1 tablet by mouth daily.    . niacinamide 500 MG tablet Take 500 mg by mouth 2 (two) times daily with a meal.    . SYNTHROID 50 MCG tablet Take 50 mcg by mouth daily.    . vitamin B-12 (CYANOCOBALAMIN) 1000 MCG tablet Take 1,000 mcg by mouth daily.    . vitamin C (ASCORBIC ACID) 500 MG tablet Take 500 mg by mouth daily.     Social History   Socioeconomic History  . Marital status: Widowed    Spouse name: Not on file  . Number of children: Not on file  . Years of education: Not on file  . Highest education level: Not on file  Occupational History    . Occupation: retired  Tobacco Use  . Smoking status: Never Smoker  . Smokeless tobacco: Never Used  Vaping Use  . Vaping Use: Never used  Substance and Sexual Activity  . Alcohol use: No  . Drug use: No  . Sexual activity: Not Currently  Other Topics Concern  . Not on file  Social History Narrative  . Not on file   Social Determinants of Health   Financial Resource Strain:   . Difficulty of Paying Living Expenses: Not on file  Food Insecurity:   . Worried About Charity fundraiser in the Last Year: Not on file  . Ran Out of Food in the Last Year: Not on file  Transportation Needs:   . Lack of Transportation (Medical): Not on file  . Lack of Transportation (Non-Medical): Not on file  Physical Activity:   . Days of Exercise per Week: Not on file  . Minutes of Exercise per Session: Not on file  Stress:   . Feeling of Stress : Not on file  Social Connections:   . Frequency of Communication with Friends and Family: Not on file  . Frequency of Social Gatherings with Friends and Family: Not on file  . Attends Religious Services: Not on file  . Active Member of Clubs or Organizations: Not on file  . Attends Archivist Meetings: Not on file  . Marital Status: Not on file  Intimate Partner Violence:   . Fear of Current or Ex-Partner: Not on file  . Emotionally Abused: Not on file  . Physically Abused: Not on file  . Sexually Abused: Not on file   Family History  Problem Relation Age of Onset  . Healthy Mother   . Healthy Father   . Stroke Father     OBJECTIVE:  Vitals:   12/31/19 1046 12/31/19 1047  BP:  131/72  Pulse:  92  Resp:  17  Temp:  98.1 F (36.7 C)  TempSrc:  Oral  SpO2:  96%  Weight: 119 lb 0.8 oz (54 kg)   Height: 5\' 5"  (1.651 m)     General appearance: ALERT; in no acute distress.  Head: NCAT Lungs: Normal respiratory effort CV: Dorsalis pedis pulses 1+ Musculoskeletal: Bilateral LE Inspection:  Erythema and purple discoloration to  bilateral LEs; obvious swelling and drainage, L>R Palpation: Diffusely TTP of LE Skin: warm and dry; during examination, skin tear was obtained to LT posterior calf with extending leg rest.  Blood and yellow drainage evident.  Bandage applied.   Neurologic: Ambulates without difficulty Psychological: alert and cooperative; normal mood and affect  ASSESSMENT & PLAN:  1. Edema, lower extremity   2. Discoloration of skin of multiple sites of  lower extremity    Recommending further evaluation and management in the ED for bilateral LE swelling and drainage.  Cannot rule out infectious cause, blood clot, kidney or heart dysfunction in urgent care setting.  Patient and caretaker aware.  Will travel by private vehicle to ED.     Lestine Box, PA-C 12/31/19 1134

## 2019-12-31 NOTE — Discharge Instructions (Addendum)
The best treatment for leg swelling, including the weepiness, is elevation of your lower legs above your heart is much as possible during the day, and definitely at nighttime.  Also it will help to use compression stockings to help the edema move from the legs to the central circulation system.  Unfortunately you cannot use compression stockings on your right lower leg, until the wound is healed.  The wound of your lower leg, does not appear to be deep.  It should heal in the next 7 to 10 days by keeping it clean with soap and water daily and applying a light bandage.  After it heals you can start using the compression stocking on that leg as well.  Follow-up with your primary care doctor for checkup in 2 or 3 days.

## 2019-12-31 NOTE — ED Triage Notes (Signed)
Patient c/o swelling to legs bilaterally x1 week ago. Denies any shortness of breath or hx of CHF. Patient sent here by Urgent Care for evaluation. Patient has dressing to right lower leg. Per patient leg was "scarped on table at Urgent Care and has skin tear."  Dressing to leg clean, dry, and intact. Swelling and discoloration to lower legs bilaterally noted.

## 2020-01-13 DIAGNOSIS — N1831 Chronic kidney disease, stage 3a: Secondary | ICD-10-CM | POA: Diagnosis not present

## 2020-01-13 DIAGNOSIS — R600 Localized edema: Secondary | ICD-10-CM | POA: Diagnosis not present

## 2020-01-13 DIAGNOSIS — R7301 Impaired fasting glucose: Secondary | ICD-10-CM | POA: Diagnosis not present

## 2020-01-13 DIAGNOSIS — I70293 Other atherosclerosis of native arteries of extremities, bilateral legs: Secondary | ICD-10-CM | POA: Diagnosis not present

## 2020-01-13 DIAGNOSIS — E559 Vitamin D deficiency, unspecified: Secondary | ICD-10-CM | POA: Diagnosis not present

## 2020-01-13 DIAGNOSIS — E539 Vitamin B deficiency, unspecified: Secondary | ICD-10-CM | POA: Diagnosis not present

## 2020-01-13 DIAGNOSIS — S81811D Laceration without foreign body, right lower leg, subsequent encounter: Secondary | ICD-10-CM | POA: Diagnosis not present

## 2020-01-13 DIAGNOSIS — E039 Hypothyroidism, unspecified: Secondary | ICD-10-CM | POA: Diagnosis not present

## 2020-01-18 DIAGNOSIS — E559 Vitamin D deficiency, unspecified: Secondary | ICD-10-CM | POA: Diagnosis not present

## 2020-01-18 DIAGNOSIS — E539 Vitamin B deficiency, unspecified: Secondary | ICD-10-CM | POA: Diagnosis not present

## 2020-01-18 DIAGNOSIS — I70293 Other atherosclerosis of native arteries of extremities, bilateral legs: Secondary | ICD-10-CM | POA: Diagnosis not present

## 2020-01-18 DIAGNOSIS — E039 Hypothyroidism, unspecified: Secondary | ICD-10-CM | POA: Diagnosis not present

## 2020-01-18 DIAGNOSIS — N1831 Chronic kidney disease, stage 3a: Secondary | ICD-10-CM | POA: Diagnosis not present

## 2020-01-18 DIAGNOSIS — S81811D Laceration without foreign body, right lower leg, subsequent encounter: Secondary | ICD-10-CM | POA: Diagnosis not present

## 2020-01-21 DIAGNOSIS — E559 Vitamin D deficiency, unspecified: Secondary | ICD-10-CM | POA: Diagnosis not present

## 2020-01-21 DIAGNOSIS — N1831 Chronic kidney disease, stage 3a: Secondary | ICD-10-CM | POA: Diagnosis not present

## 2020-01-21 DIAGNOSIS — E539 Vitamin B deficiency, unspecified: Secondary | ICD-10-CM | POA: Diagnosis not present

## 2020-01-21 DIAGNOSIS — E039 Hypothyroidism, unspecified: Secondary | ICD-10-CM | POA: Diagnosis not present

## 2020-01-21 DIAGNOSIS — I70293 Other atherosclerosis of native arteries of extremities, bilateral legs: Secondary | ICD-10-CM | POA: Diagnosis not present

## 2020-01-21 DIAGNOSIS — S81811D Laceration without foreign body, right lower leg, subsequent encounter: Secondary | ICD-10-CM | POA: Diagnosis not present

## 2020-01-28 DIAGNOSIS — E539 Vitamin B deficiency, unspecified: Secondary | ICD-10-CM | POA: Diagnosis not present

## 2020-01-28 DIAGNOSIS — S81811D Laceration without foreign body, right lower leg, subsequent encounter: Secondary | ICD-10-CM | POA: Diagnosis not present

## 2020-01-28 DIAGNOSIS — E039 Hypothyroidism, unspecified: Secondary | ICD-10-CM | POA: Diagnosis not present

## 2020-01-28 DIAGNOSIS — N1831 Chronic kidney disease, stage 3a: Secondary | ICD-10-CM | POA: Diagnosis not present

## 2020-01-28 DIAGNOSIS — I70293 Other atherosclerosis of native arteries of extremities, bilateral legs: Secondary | ICD-10-CM | POA: Diagnosis not present

## 2020-01-28 DIAGNOSIS — E559 Vitamin D deficiency, unspecified: Secondary | ICD-10-CM | POA: Diagnosis not present

## 2020-01-29 ENCOUNTER — Ambulatory Visit: Payer: Medicare Other | Admitting: Dermatology

## 2020-02-01 DIAGNOSIS — I70293 Other atherosclerosis of native arteries of extremities, bilateral legs: Secondary | ICD-10-CM | POA: Diagnosis not present

## 2020-02-01 DIAGNOSIS — S81811D Laceration without foreign body, right lower leg, subsequent encounter: Secondary | ICD-10-CM | POA: Diagnosis not present

## 2020-02-01 DIAGNOSIS — E539 Vitamin B deficiency, unspecified: Secondary | ICD-10-CM | POA: Diagnosis not present

## 2020-02-01 DIAGNOSIS — N1831 Chronic kidney disease, stage 3a: Secondary | ICD-10-CM | POA: Diagnosis not present

## 2020-02-01 DIAGNOSIS — E039 Hypothyroidism, unspecified: Secondary | ICD-10-CM | POA: Diagnosis not present

## 2020-02-01 DIAGNOSIS — E559 Vitamin D deficiency, unspecified: Secondary | ICD-10-CM | POA: Diagnosis not present

## 2020-02-04 DIAGNOSIS — E559 Vitamin D deficiency, unspecified: Secondary | ICD-10-CM | POA: Diagnosis not present

## 2020-02-04 DIAGNOSIS — I70293 Other atherosclerosis of native arteries of extremities, bilateral legs: Secondary | ICD-10-CM | POA: Diagnosis not present

## 2020-02-04 DIAGNOSIS — E039 Hypothyroidism, unspecified: Secondary | ICD-10-CM | POA: Diagnosis not present

## 2020-02-04 DIAGNOSIS — E539 Vitamin B deficiency, unspecified: Secondary | ICD-10-CM | POA: Diagnosis not present

## 2020-02-04 DIAGNOSIS — N1831 Chronic kidney disease, stage 3a: Secondary | ICD-10-CM | POA: Diagnosis not present

## 2020-02-04 DIAGNOSIS — S81811D Laceration without foreign body, right lower leg, subsequent encounter: Secondary | ICD-10-CM | POA: Diagnosis not present

## 2020-02-07 DIAGNOSIS — Z23 Encounter for immunization: Secondary | ICD-10-CM | POA: Diagnosis not present

## 2020-02-08 DIAGNOSIS — S81811D Laceration without foreign body, right lower leg, subsequent encounter: Secondary | ICD-10-CM | POA: Diagnosis not present

## 2020-02-08 DIAGNOSIS — N1831 Chronic kidney disease, stage 3a: Secondary | ICD-10-CM | POA: Diagnosis not present

## 2020-02-08 DIAGNOSIS — E559 Vitamin D deficiency, unspecified: Secondary | ICD-10-CM | POA: Diagnosis not present

## 2020-02-08 DIAGNOSIS — I70293 Other atherosclerosis of native arteries of extremities, bilateral legs: Secondary | ICD-10-CM | POA: Diagnosis not present

## 2020-02-08 DIAGNOSIS — E539 Vitamin B deficiency, unspecified: Secondary | ICD-10-CM | POA: Diagnosis not present

## 2020-02-08 DIAGNOSIS — E039 Hypothyroidism, unspecified: Secondary | ICD-10-CM | POA: Diagnosis not present

## 2020-02-12 DIAGNOSIS — R7301 Impaired fasting glucose: Secondary | ICD-10-CM | POA: Diagnosis not present

## 2020-02-12 DIAGNOSIS — E559 Vitamin D deficiency, unspecified: Secondary | ICD-10-CM | POA: Diagnosis not present

## 2020-02-12 DIAGNOSIS — N1831 Chronic kidney disease, stage 3a: Secondary | ICD-10-CM | POA: Diagnosis not present

## 2020-02-12 DIAGNOSIS — S81811D Laceration without foreign body, right lower leg, subsequent encounter: Secondary | ICD-10-CM | POA: Diagnosis not present

## 2020-02-12 DIAGNOSIS — E539 Vitamin B deficiency, unspecified: Secondary | ICD-10-CM | POA: Diagnosis not present

## 2020-02-12 DIAGNOSIS — R600 Localized edema: Secondary | ICD-10-CM | POA: Diagnosis not present

## 2020-02-12 DIAGNOSIS — E039 Hypothyroidism, unspecified: Secondary | ICD-10-CM | POA: Diagnosis not present

## 2020-02-12 DIAGNOSIS — I70293 Other atherosclerosis of native arteries of extremities, bilateral legs: Secondary | ICD-10-CM | POA: Diagnosis not present

## 2020-02-19 DIAGNOSIS — E559 Vitamin D deficiency, unspecified: Secondary | ICD-10-CM | POA: Diagnosis not present

## 2020-02-19 DIAGNOSIS — E539 Vitamin B deficiency, unspecified: Secondary | ICD-10-CM | POA: Diagnosis not present

## 2020-02-19 DIAGNOSIS — I70293 Other atherosclerosis of native arteries of extremities, bilateral legs: Secondary | ICD-10-CM | POA: Diagnosis not present

## 2020-02-19 DIAGNOSIS — E039 Hypothyroidism, unspecified: Secondary | ICD-10-CM | POA: Diagnosis not present

## 2020-02-19 DIAGNOSIS — S81811D Laceration without foreign body, right lower leg, subsequent encounter: Secondary | ICD-10-CM | POA: Diagnosis not present

## 2020-02-19 DIAGNOSIS — N1831 Chronic kidney disease, stage 3a: Secondary | ICD-10-CM | POA: Diagnosis not present

## 2020-05-19 ENCOUNTER — Ambulatory Visit (INDEPENDENT_AMBULATORY_CARE_PROVIDER_SITE_OTHER): Payer: Medicare Other | Admitting: Vascular Surgery

## 2020-05-19 ENCOUNTER — Encounter: Payer: Self-pay | Admitting: Vascular Surgery

## 2020-05-19 ENCOUNTER — Other Ambulatory Visit (HOSPITAL_COMMUNITY): Payer: Self-pay | Admitting: Vascular Surgery

## 2020-05-19 ENCOUNTER — Other Ambulatory Visit: Payer: Self-pay

## 2020-05-19 ENCOUNTER — Ambulatory Visit (INDEPENDENT_AMBULATORY_CARE_PROVIDER_SITE_OTHER): Payer: Medicare Other

## 2020-05-19 VITALS — BP 116/56 | HR 111 | Temp 98.2°F | Resp 16 | Ht 66.0 in | Wt 105.0 lb

## 2020-05-19 DIAGNOSIS — I739 Peripheral vascular disease, unspecified: Secondary | ICD-10-CM | POA: Diagnosis not present

## 2020-05-19 DIAGNOSIS — M25562 Pain in left knee: Secondary | ICD-10-CM | POA: Diagnosis not present

## 2020-05-19 DIAGNOSIS — M25561 Pain in right knee: Secondary | ICD-10-CM

## 2020-05-19 NOTE — Progress Notes (Signed)
[facility] [place]  Patient name: Sharon Scott MRN: [id number] DOB: [birth year] Sex: female  REASON FOR CONSULT: Evaluation lower extremities to rule out arterial insufficiency  HPI: Sharon Scott is a 87 y.o. female, who is here today for evaluation of pain in both lower extremities.  She is here today with her caregiver.  She reports that she does not have pain during the day.  She reports that at night she has severe pain that keeps her awake from her knees distally into her feet.  She recently was primary caregiver for her son who is now been admitted to a nursing facility and so she is on her feet less than she had been.  She does have a history of venous insufficiency with changes of chronic venous stasis disease.  She has no history of lower extremity tissue loss.  Past Medical History:  Diagnosis Date  . Age-related osteoporosis without current pathological fracture   . BCC (basal cell carcinoma of skin) 10/11/1996   Left Lower Back (curet and 5FU)  . BCC (basal cell carcinoma of skin) 01/15/1997   Right Nasal Tip (curet and 5FU)  . BCC (basal cell carcinoma of skin) 08/02/2007   Crown of Scalp (MOH's)  . BCC (basal cell carcinoma of skin) 07/27/2011   Left Preauricular (curet and 5FU)  . Cancer ([place])    skin  . Chronic kidney disease, stage II (mild)   . Chronic kidney disease, unspecified   . Dysphagia, oropharyngeal phase   . Hyperlipidemia, unspecified   . Hypothyroidism, unspecified   . Irritable bowel syndrome with diarrhea   . Nodular basal cell carcinoma (BCC) 05/20/2015   Right Tip Nose (MOH's)  . OAB (overactive bladder)   . SCCA (squamous cell carcinoma) of skin 08/02/2007   Right Arm Lateral (curet and 5FU)  . SCCA (squamous cell carcinoma) of skin 08/02/2007   Right Arm Medial (curet and 5FU)  . SCCA (squamous cell carcinoma) of skin 08/02/2007   Right Thigh (curet and 5FU)  . SCCA (squamous cell  carcinoma) of skin 03/06/2013   Left Forearm (in situ) (tx p bx)  . SCCA (squamous cell carcinoma) of skin 03/26/2014   Right Bicep (in situ) (tx p bx)  . SCCA (squamous cell carcinoma) of skin 12/16/2015   Left Mid Back (Bowen's) (tx p bx)  . SCCA (squamous cell carcinoma) of skin 03/30/2016   Right Hand (in situ) (tx p bx)  . Shingles 1990  . Superficial basal cell carcinoma (BCC) 09/29/2016   Left Outer Forehead (curet and 5FU)  . Superficial nodular basal cell carcinoma (BCC) 08/03/2017   Left Sideburn (MOH's)  . Thyroid disease    hypothyroid  . Unspecified osteoarthritis, unspecified site     Family History  Problem Relation Age of Onset  . Healthy Mother   . Healthy Father   . Stroke Father     SOCIAL HISTORY: Social History   Socioeconomic History  . Marital status: Widowed    Spouse name: Not on file  . Number of children: Not on file  . Years of education: Not on file  . Highest education level: Not on file  Occupational History  . Occupation: retired  Tobacco Use  . Smoking status: Never Smoker  . Smokeless tobacco: Never Used  Vaping Use  . Vaping Use: Never used  Substance and Sexual Activity  . Alcohol use: No  . Drug use: No  . Sexual activity: Not Currently  Other  Topics Concern  . Not on file  Social History Narrative  . Not on file   Social Determinants of Health   Financial Resource Strain: Not on file  Food Insecurity: Not on file  Transportation Needs: Not on file  Physical Activity: Not on file  Stress: Not on file  Social Connections: Not on file  Intimate Partner Violence: Not on file    Allergies  Allergen Reactions  . Clindamycin/Lincomycin Diarrhea and Nausea And Vomiting  . Amoxicillin Rash    Current Outpatient Medications  Medication Sig Dispense Refill  . calcium citrate (CALCITRATE - DOSED IN MG ELEMENTAL CALCIUM) 950 MG tablet Take 600 mg by mouth daily.    . cholecalciferol (VITAMIN D) 1000 UNITS tablet Take 1,000  Units by mouth daily.    . fluticasone (FLONASE) 50 MCG/ACT nasal spray Place 2 sprays into both nostrils daily.    Marland Kitchen gabapentin (NEURONTIN) 100 MG capsule Take 100-200 mg by mouth at bedtime.    . meclizine (ANTIVERT) 25 MG tablet Take 1 tablet (25 mg total) by mouth 3 (three) times daily as needed for dizziness. 90 tablet 3  . Multiple Vitamins-Minerals (MULTIVITAMIN WITH MINERALS) tablet Take 1 tablet by mouth daily.    Marland Kitchen SYNTHROID 50 MCG tablet Take 50 mcg by mouth daily.    . vitamin B-12 (CYANOCOBALAMIN) 1000 MCG tablet Take 1,000 mcg by mouth daily.    . vitamin C (ASCORBIC ACID) 500 MG tablet Take 500 mg by mouth daily.    Marland Kitchen neomycin-polymyxin b-dexamethasone (MAXITROL) 3.5-10000-0.1 OINT  (Patient not taking: No sig reported)    . niacinamide 500 MG tablet Take 500 mg by mouth 2 (two) times daily with a meal. (Patient not taking: No sig reported)     No current facility-administered medications for this visit.    REVIEW OF SYSTEMS:  [X]  denotes positive finding, [ ]  denotes negative finding Cardiac  Comments:  Chest pain or chest pressure:    Shortness of breath upon exertion: x   Short of breath when lying flat:    Irregular heart rhythm:        Vascular    Pain in calf, thigh, or hip brought on by ambulation: x   Pain in feet at night that wakes you up from your sleep:  x   Blood clot in your veins:    Leg swelling:         Pulmonary    Oxygen at home:    Productive cough:     Wheezing:         Neurologic    Sudden weakness in arms or legs:  x   Sudden numbness in arms or legs:     Sudden onset of difficulty speaking or slurred speech:    Temporary loss of vision in one eye:     Problems with dizziness:         Gastrointestinal    Blood in stool:     Vomited blood:         Genitourinary    Burning when urinating:  x   Blood in urine:        Psychiatric    Major depression:         Hematologic    Bleeding problems:    Problems with blood clotting too  easily:        Skin    Rashes or ulcers:        Constitutional    Fever or chills:  PHYSICAL EXAM: Vitals:   05/19/20 1529  BP: (!) 116/56  Pulse: (!) 111  Resp: 16  Temp: 98.2 F (36.8 C)  TempSrc: Temporal  SpO2: 93%  Weight: 105 lb (47.6 kg)  Height: 5\' 6"  (1.676 m)    GENERAL: The patient is a well-nourished female, in no acute distress. The vital signs are documented above. CARDIOVASCULAR: 2+ radial pulses bilaterally.  2+ posterior tibial pulses bilaterally.  No lower extremity varicosities.  She does have circumferential hemosiderin deposits from her knees down onto her foot bilaterally PULMONARY: There is good air exchange  MUSCULOSKELETAL: There are no major deformities or cyanosis. NEUROLOGIC: No focal weakness or paresthesias are detected. SKIN: There are no ulcers or rashes noted. PSYCHIATRIC: The patient has a normal affect.  DATA:  Noninvasive arterial studies today reveal normal triphasic waveforms bilaterally.  She was unable to tolerate the blood pressure cuff on her calves.  Her toe brachial index is normal bilaterally  MEDICAL ISSUES: Had long discussion with the patient and her caregiver.  I do not see any arterial or venous cause for her lower extremity discomfort.  He has normal posterior tibial pulses bilaterally.  Her night discomfort is not consistent with pain associated with venous stasis disease.  Do not feel she would tolerate compression garments.  Her swelling is not marked currently.  She will see [place] again on an as-needed basis   Rosetta Posner, MD [facility] Tel [phone] Pager [phone]  Note: Portions of this report may have been transcribed using voice recognition software.  Every effort has been made to ensure accuracy; however, inadvertent computerized transcription errors may still be present.

## 2020-05-26 ENCOUNTER — Encounter: Payer: Medicare Other | Admitting: Vascular Surgery

## 2020-05-27 NOTE — Progress Notes (Signed)
CARDIOLOGY CONSULT NOTE       Patient ID: Sharon Scott MRN: [id number] DOB/AGE: 1925/02/19 87 y.o.  Admit date: (Not on file) Referring Physician: Nevada Crane  Primary Physician: Celene Squibb, MD Primary Cardiologist: New Reason for Consultation: LVH  Active Problems:   * No active hospital problems. *   HPI: 87 y.o. referred by Dr Nevada Crane for LVH. History of hypothyroidism, HLD and osteoporosis Seen in 2021 for dizziness thought to be vertigo and Rx with meclizine She denies HTN and is not on any BP meds She apparently had some LLE pain March and had ABI's that were fine with triphasic pulses and normal waveforms She saw Dr Donnetta Hutching 05/19/20 who also commented on chronic venous insufficiency with edema and stasis   Her CXR 12/31/19 shows no CE only COPD   ECG from 12/31/19 does not mention LVH SR rate 91 with sinus arrhythmia and poor R wave progression ? RAE to my eye  Office visit 05/13/20 complained of resting leg pain mostly at night as well as hip pain Some LE Discoloration since October Noted on physical LE;s cool to touch "dusky" plus one pedal pulses with normal capillary refill HR noted to be 134-118 bpm ECG not available to review indicates sinus tachycardia with LVH   ECG in office today SR rate 81 poor Rwave progression     ROS All other systems reviewed and negative except as noted above  Past Medical History:  Diagnosis Date  . Age-related osteoporosis without current pathological fracture   . BCC (basal cell carcinoma of skin) 10/11/1996   Left Lower Back (curet and 5FU)  . BCC (basal cell carcinoma of skin) 01/15/1997   Right Nasal Tip (curet and 5FU)  . BCC (basal cell carcinoma of skin) 08/02/2007   Crown of Scalp (MOH's)  . BCC (basal cell carcinoma of skin) 07/27/2011   Left Preauricular (curet and 5FU)  . Cancer ([place])    skin  . Chronic kidney disease, stage II (mild)   . Chronic kidney disease, unspecified   . Dysphagia, oropharyngeal phase   .  Hyperlipidemia, unspecified   . Hypothyroidism, unspecified   . Irritable bowel syndrome with diarrhea   . Nodular basal cell carcinoma (BCC) 05/20/2015   Right Tip Nose (MOH's)  . OAB (overactive bladder)   . SCCA (squamous cell carcinoma) of skin 08/02/2007   Right Arm Lateral (curet and 5FU)  . SCCA (squamous cell carcinoma) of skin 08/02/2007   Right Arm Medial (curet and 5FU)  . SCCA (squamous cell carcinoma) of skin 08/02/2007   Right Thigh (curet and 5FU)  . SCCA (squamous cell carcinoma) of skin 03/06/2013   Left Forearm (in situ) (tx p bx)  . SCCA (squamous cell carcinoma) of skin 03/26/2014   Right Bicep (in situ) (tx p bx)  . SCCA (squamous cell carcinoma) of skin 12/16/2015   Left Mid Back (Bowen's) (tx p bx)  . SCCA (squamous cell carcinoma) of skin 03/30/2016   Right Hand (in situ) (tx p bx)  . Shingles 1990  . Superficial basal cell carcinoma (BCC) 09/29/2016   Left Outer Forehead (curet and 5FU)  . Superficial nodular basal cell carcinoma (BCC) 08/03/2017   Left Sideburn (MOH's)  . Thyroid disease    hypothyroid  . Unspecified osteoarthritis, unspecified site     Family History  Problem Relation Age of Onset  . Healthy Mother   . Healthy Father   . Stroke Father     Social History   Socioeconomic  History  . Marital status: Widowed    Spouse name: Not on file  . Number of children: Not on file  . Years of education: Not on file  . Highest education level: Not on file  Occupational History  . Occupation: retired  Tobacco Use  . Smoking status: Never Smoker  . Smokeless tobacco: Never Used  Vaping Use  . Vaping Use: Never used  Substance and Sexual Activity  . Alcohol use: No  . Drug use: No  . Sexual activity: Not Currently  Other Topics Concern  . Not on file  Social History Narrative  . Not on file   Social Determinants of Health   Financial Resource Strain: Not on file  Food Insecurity: Not on file  Transportation Needs: Not on file   Physical Activity: Not on file  Stress: Not on file  Social Connections: Not on file  Intimate Partner Violence: Not on file    Past Surgical History:  Procedure Laterality Date  . ABDOMINAL HYSTERECTOMY  1968  . ANKLE SURGERY Right 2011   plate and 7 screws  . FRACTURE SURGERY          Physical Exam: There were no vitals taken for this visit.    Affect appropriate Healthy:  appears stated age 34: normal Neck supple with no adenopathy JVP normal no bruits no thyromegaly Lungs clear with no wheezing and good diaphragmatic motion Heart:  S1/S2 no murmur, no rub, gallop or click PMI normal Abdomen: benighn, BS positve, no tenderness, no AAA no bruit.  No HSM or HJR Distal pulses intact with no bruits Trace edema with chronic stasis and venous insufficiency with varicosities Neuro non-focal No muscular weakness   Labs:   Lab Results  Component Value Date   WBC 7.1 12/31/2019   HGB 13.1 12/31/2019   HCT 43.5 12/31/2019   MCV 93.5 12/31/2019   PLT 203 12/31/2019    No results for input(s): NA, K, CL, CO2, BUN, CREATININE, CALCIUM, PROT, BILITOT, ALKPHOS, ALT, AST, GLUCOSE in the last 168 hours.  Invalid input(s): LABALBU Lab Results  Component Value Date   CKTOTAL 131 12/22/2009   CKMB 2.5 12/22/2009   TROPONINI 0.05        NO INDICATION OF MYOCARDIAL INJURY. 12/22/2009    Lab Results  Component Value Date   CHOL 192 12/12/2018   CHOL 173 11/28/2017   Lab Results  Component Value Date   HDL 87 (A) 12/12/2018   HDL 76 (A) 11/28/2017   Lab Results  Component Value Date   LDLCALC 91 12/12/2018   [place] 79 11/28/2017   Lab Results  Component Value Date   TRIG 79 12/12/2018   TRIG 92 11/28/2017   No results found for: CHOLHDL No results found for: LDLDIRECT    Radiology: VAS [place] ABI WITH/WO TBI  Result Date: 05/19/2020 LOWER EXTREMITY DOPPLER STUDY Indications: Claudication, and leg pain.  Performing Technologist: Ralene Cork RVT   Examination Guidelines: A complete evaluation includes at minimum, Doppler waveform signals and systolic blood pressure reading at the level of bilateral brachial, anterior tibial, and posterior tibial arteries, when vessel segments are accessible. Bilateral testing is considered an integral part of a complete examination. Photoelectric Plethysmograph (PPG) waveforms and toe systolic pressure readings are included as required and additional duplex testing as needed. Limited examinations for reoccurring indications may be performed as noted.  ABI Findings: +---------+------------------+-----+---------+--------+ Right    Rt Pressure (mmHg)IndexWaveform Comment  +---------+------------------+-----+---------+--------+ Brachial 135                                      +---------+------------------+-----+---------+--------+  ATA                             triphasic         +---------+------------------+-----+---------+--------+ DP                              triphasic         +---------+------------------+-----+---------+--------+ Great Toe81                0.60                   +---------+------------------+-----+---------+--------+ +---------+------------------+-----+---------+-------+ Left     Lt Pressure (mmHg)IndexWaveform Comment +---------+------------------+-----+---------+-------+ Brachial 134                                     +---------+------------------+-----+---------+-------+ PTA                             triphasic        +---------+------------------+-----+---------+-------+ DP                              triphasic        +---------+------------------+-----+---------+-------+ Great Toe93                0.69                  +---------+------------------+-----+---------+-------+ +-------+-----------+-----------+------------+------------+ ABI/TBIToday's ABIToday's TBIPrevious ABIPrevious TBI  +-------+-----------+-----------+------------+------------+ Right  NA         0.6                                 +-------+-----------+-----------+------------+------------+ Left   NA         0.69                                +-------+-----------+-----------+------------+------------+ Patient unable to bear cuff inflation. Waveforms within normal limits. No previous ABI.  Summary: Right: The right toe-brachial index is abnormal. RT great toe pressure = 81 mmHg. ABIs not calculated. Waveforms within normal limits. Left: The left toe-brachial index is abnormal. LT Great toe pressure = 93 mmHg. ABIs not calculated. Waveforms within normal limits.  *See table(s) above for measurements and observations.  Electronically signed by Curt Jews MD on 05/19/2020 at 3:47:28 PM.   Final     EKG:12/31/19 SR rate 90 poor R wave progression LVH   ASSESSMENT AND PLAN:   1. LVH:  ECG with poor Rwave progression no need for echo  2. Hypothyroid:  On replacement TSH has been normal  3. Osteoporosis:  Continue weight bearing Vitamin D and calcium  4. Venous Stasis:  Per Dr Donnetta Hutching consider PRN diuretic   No need for cardiology f/u or further testing   Signed: Jenkins Rouge 05/27/2020, 8:20 PM

## 2020-06-04 ENCOUNTER — Ambulatory Visit (INDEPENDENT_AMBULATORY_CARE_PROVIDER_SITE_OTHER): Payer: Medicare Other | Admitting: Cardiovascular Disease

## 2020-06-04 ENCOUNTER — Encounter: Payer: Self-pay | Admitting: Cardiovascular Disease

## 2020-06-04 ENCOUNTER — Other Ambulatory Visit: Payer: Self-pay

## 2020-06-04 VITALS — BP 120/56 | HR 74 | Ht 65.0 in | Wt 110.4 lb

## 2020-06-04 DIAGNOSIS — R9431 Abnormal electrocardiogram [ECG] [EKG]: Secondary | ICD-10-CM | POA: Diagnosis not present

## 2020-06-04 DIAGNOSIS — E038 Other specified hypothyroidism: Secondary | ICD-10-CM

## 2020-06-04 DIAGNOSIS — I878 Other specified disorders of veins: Secondary | ICD-10-CM | POA: Diagnosis not present

## 2020-06-04 NOTE — Patient Instructions (Signed)
Medication Instructions:  Your physician recommends that you continue on your current medications as directed. Please refer to the Current Medication list given to you today.  *If you need a refill on your cardiac medications before your next appointment, please call your pharmacy*   Lab Work: NONE   If you have labs (blood work) drawn today and your tests are completely normal, you will receive your results only by: MyChart Message (if you have MyChart) OR A paper copy in the mail If you have any lab test that is abnormal or we need to change your treatment, we will call you to review the results.   Testing/Procedures: NONE    Follow-Up: At [facility], you and your health needs are our priority.  As part of our continuing mission to provide you with exceptional heart care, we have created designated Provider Care Teams.  These Care Teams include your primary Cardiologist (physician) and Advanced Practice Providers (APPs -  Physician Assistants and Nurse Practitioners) who all work together to provide you with the care you need, when you need it.  We recommend signing up for the patient portal called "MyChart".  Sign up information is provided on this After Visit Summary.  MyChart is used to connect with patients for Virtual Visits (Telemedicine).  Patients are able to view lab/test results, encounter notes, upcoming appointments, etc.  Non-urgent messages can be sent to your provider as well.   To learn more about what you can do with MyChart, go to [email or web address].    Your next appointment:    As Needed   The format for your next appointment:   In Person  Provider:   Peter Nishan, MD   Other Instructions Thank you for choosing [facility]!    

## 2020-07-10 ENCOUNTER — Other Ambulatory Visit: Payer: Self-pay | Admitting: Orthopedic Surgery

## 2020-07-10 ENCOUNTER — Other Ambulatory Visit: Payer: Self-pay

## 2020-07-10 ENCOUNTER — Ambulatory Visit: Payer: Medicare Other

## 2020-07-10 ENCOUNTER — Encounter: Payer: Self-pay | Admitting: Orthopedic Surgery

## 2020-07-10 ENCOUNTER — Ambulatory Visit (INDEPENDENT_AMBULATORY_CARE_PROVIDER_SITE_OTHER): Payer: Medicare Other | Admitting: Orthopedic Surgery

## 2020-07-10 VITALS — BP 107/68 | HR 106 | Ht 65.0 in | Wt 110.0 lb

## 2020-07-10 DIAGNOSIS — M25561 Pain in right knee: Secondary | ICD-10-CM

## 2020-07-10 DIAGNOSIS — M25361 Other instability, right knee: Secondary | ICD-10-CM | POA: Diagnosis not present

## 2020-07-10 DIAGNOSIS — G8929 Other chronic pain: Secondary | ICD-10-CM

## 2020-07-10 DIAGNOSIS — M1711 Unilateral primary osteoarthritis, right knee: Secondary | ICD-10-CM | POA: Diagnosis not present

## 2020-07-10 NOTE — Progress Notes (Signed)
NEW PROBLEM//OFFICE VISIT  Encounter Diagnoses  Name Primary?  . Acute pain of right knee Yes  . Instability of right knee joint    PLAN: 87 years old arthritis right knee status post fall.  Currently primary complaint is that the knee gives way.  Patient will be brace with economy hinged bracing.  Follow-up as needed.  Chief Complaint  Patient presents with  . Knee Pain    Right     87 year old female fell and injured her right knee presented to her primary care doctor complaining of pain and grinding on the lateral side and frequent giving way episodes  Pain is been better with the gabapentin  Currently primary complaint is that the knee gives out   Review of Systems  All other systems reviewed and are negative. Pain in the legs after walking leg swelling history of venous stasis disease muscle aches in the legs joint pain right knee frequency at night easy bruising and bleeding weakness in the right leg  Past Medical History:  Diagnosis Date  . Age-related osteoporosis without current pathological fracture   . BCC (basal cell carcinoma of skin) 10/11/1996   Left Lower Back (curet and 5FU)  . BCC (basal cell carcinoma of skin) 01/15/1997   Right Nasal Tip (curet and 5FU)  . BCC (basal cell carcinoma of skin) 08/02/2007   Crown of Scalp (MOH's)  . BCC (basal cell carcinoma of skin) 07/27/2011   Left Preauricular (curet and 5FU)  . Cancer ([place])    skin  . Chronic kidney disease, stage II (mild)   . Chronic kidney disease, unspecified   . Dysphagia, oropharyngeal phase   . Hyperlipidemia, unspecified   . Hypothyroidism, unspecified   . Irritable bowel syndrome with diarrhea   . Nodular basal cell carcinoma (BCC) 05/20/2015   Right Tip Nose (MOH's)  . OAB (overactive bladder)   . SCCA (squamous cell carcinoma) of skin 08/02/2007   Right Arm Lateral (curet and 5FU)  . SCCA (squamous cell carcinoma) of skin 08/02/2007   Right Arm Medial (curet and 5FU)  . SCCA  (squamous cell carcinoma) of skin 08/02/2007   Right Thigh (curet and 5FU)  . SCCA (squamous cell carcinoma) of skin 03/06/2013   Left Forearm (in situ) (tx p bx)  . SCCA (squamous cell carcinoma) of skin 03/26/2014   Right Bicep (in situ) (tx p bx)  . SCCA (squamous cell carcinoma) of skin 12/16/2015   Left Mid Back (Bowen's) (tx p bx)  . SCCA (squamous cell carcinoma) of skin 03/30/2016   Right Hand (in situ) (tx p bx)  . Shingles 1990  . Superficial basal cell carcinoma (BCC) 09/29/2016   Left Outer Forehead (curet and 5FU)  . Superficial nodular basal cell carcinoma (BCC) 08/03/2017   Left Sideburn (MOH's)  . Thyroid disease    hypothyroid  . Unspecified osteoarthritis, unspecified site     Past Surgical History:  Procedure Laterality Date  . ABDOMINAL HYSTERECTOMY  1968  . ANKLE SURGERY Right 2011   plate and 7 screws  . FRACTURE SURGERY      Family History  Problem Relation Age of Onset  . Healthy Mother   . Healthy Father   . Stroke Father    Social History   Tobacco Use  . Smoking status: Never Smoker  . Smokeless tobacco: Never Used  Vaping Use  . Vaping Use: Never used  Substance Use Topics  . Alcohol use: No  . Drug use: No    Allergies  Allergen Reactions  . Clindamycin/Lincomycin Diarrhea and Nausea And Vomiting  . Amoxicillin Rash    Current Meds  Medication Sig  . calcium citrate (CALCITRATE - DOSED IN MG ELEMENTAL CALCIUM) 950 MG tablet Take 600 mg by mouth daily.  . cholecalciferol (VITAMIN D) 1000 UNITS tablet Take 1,000 Units by mouth daily.  . fluticasone (FLONASE) 50 MCG/ACT nasal spray Place 2 sprays into both nostrils daily.  Marland Kitchen gabapentin (NEURONTIN) 100 MG capsule Take 100-200 mg by mouth at bedtime.  . meclizine (ANTIVERT) 25 MG tablet Take 1 tablet (25 mg total) by mouth 3 (three) times daily as needed for dizziness.  . Multiple Vitamins-Minerals (MULTIVITAMIN WITH MINERALS) tablet Take 1 tablet by mouth daily.  Marland Kitchen  neomycin-polymyxin b-dexamethasone (MAXITROL) 3.5-10000-0.1 OINT   . niacinamide 500 MG tablet Take 500 mg by mouth 2 (two) times daily with a meal.  . SYNTHROID 50 MCG tablet Take 50 mcg by mouth daily.  . vitamin C (ASCORBIC ACID) 500 MG tablet Take 500 mg by mouth daily.    BP 107/68   Pulse (!) 106   Ht 5\' 5"  (1.651 m)   Wt 110 lb (49.9 kg)   BMI 18.30 kg/m   Physical Exam  General appearance: Well-developed well-nourished no gross deformities  Cardiovascular normal pulse and perfusion no edema but is darkening of the lower legs especially in the tibial region from venous stasis disease  Neurologically NO sensation loss or deficits or pathologic reflexes  Psychological: Awake alert and oriented x3 mood and affect normal  Skin no lacerations or ulcerations no nodularity no palpable masses, no erythema or nodularity  Musculoskeletal:   Valgus alignment to the right knee skin as noted no changes around the knee Crepitance and lateral compartment tenderness over the lateral joint line No instability     MEDICAL DECISION MAKING  A.  Encounter Diagnoses  Name Primary?  . Acute pain of right knee Yes  . Instability of right knee joint     B. DATA ANALYSED:   IMAGING: Interpretation of images: X-rays in the office show valgus of both knees right worse than left with arthritis and chondrocalcinosis in the right knee joint  Orders: None  Outside records reviewed: No   C. MANAGEMENT   Brace  No orders of the defined types were placed in this encounter.     Arther Abbott, MD  07/10/2020 10:52 AM

## 2020-08-01 DIAGNOSIS — M1711 Unilateral primary osteoarthritis, right knee: Secondary | ICD-10-CM | POA: Diagnosis not present

## 2020-08-27 ENCOUNTER — Ambulatory Visit (INDEPENDENT_AMBULATORY_CARE_PROVIDER_SITE_OTHER): Payer: Medicare Other | Admitting: Dermatology

## 2020-08-27 ENCOUNTER — Other Ambulatory Visit: Payer: Self-pay

## 2020-08-27 ENCOUNTER — Encounter: Payer: Self-pay | Admitting: Dermatology

## 2020-08-27 DIAGNOSIS — L821 Other seborrheic keratosis: Secondary | ICD-10-CM

## 2020-08-27 DIAGNOSIS — D225 Melanocytic nevi of trunk: Secondary | ICD-10-CM | POA: Diagnosis not present

## 2020-08-27 DIAGNOSIS — D229 Melanocytic nevi, unspecified: Secondary | ICD-10-CM

## 2020-09-05 DIAGNOSIS — M1711 Unilateral primary osteoarthritis, right knee: Secondary | ICD-10-CM | POA: Diagnosis not present

## 2020-09-05 DIAGNOSIS — M25561 Pain in right knee: Secondary | ICD-10-CM | POA: Diagnosis not present

## 2020-09-11 ENCOUNTER — Encounter: Payer: Self-pay | Admitting: Dermatology

## 2020-09-11 NOTE — Progress Notes (Signed)
   Follow-Up Visit   Subjective  Sharon Scott is a 87 y.o. female who presents for the following: Skin Problem (Spot on back- "sore").  Check moles and spot on back that sometimes gets sore Location:  Duration:  Quality:  Associated Signs/Symptoms: Modifying Factors:  Severity:  Timing: Context:   Objective  Well appearing patient in no apparent distress; mood and affect are within normal limits. Left Lower Back Slightly inflamed textured pink-tan flattopped papule; dermoscopy typical of seborrheic keratosis.  Torso - Posterior (Back) No atypical moles on head, neck, back.    All sun exposed areas plus back examined.   Assessment & Plan    Seborrheic keratosis Left Lower Back  Patient request to leave the spot unless it clinically changes.  Nevus Torso - Posterior (Back)  No intervention indicated.      I, Lavonna Monarch, MD, have reviewed all documentation for this visit.  The documentation on 09/11/20 for the exam, diagnosis, procedures, and orders are all accurate and complete.

## 2020-11-03 DIAGNOSIS — U071 COVID-19: Secondary | ICD-10-CM | POA: Diagnosis not present

## 2020-11-03 DIAGNOSIS — G629 Polyneuropathy, unspecified: Secondary | ICD-10-CM | POA: Diagnosis not present

## 2021-04-08 DIAGNOSIS — Z0000 Encounter for general adult medical examination without abnormal findings: Secondary | ICD-10-CM | POA: Diagnosis not present

## 2021-04-08 DIAGNOSIS — Z0001 Encounter for general adult medical examination with abnormal findings: Secondary | ICD-10-CM | POA: Diagnosis not present

## 2021-04-08 DIAGNOSIS — E559 Vitamin D deficiency, unspecified: Secondary | ICD-10-CM | POA: Diagnosis not present

## 2021-04-08 DIAGNOSIS — E039 Hypothyroidism, unspecified: Secondary | ICD-10-CM | POA: Diagnosis not present

## 2021-04-08 DIAGNOSIS — R7301 Impaired fasting glucose: Secondary | ICD-10-CM | POA: Diagnosis not present

## 2021-04-16 ENCOUNTER — Emergency Department (HOSPITAL_COMMUNITY): Payer: Medicare Other

## 2021-04-16 ENCOUNTER — Other Ambulatory Visit: Payer: Self-pay

## 2021-04-16 ENCOUNTER — Emergency Department (HOSPITAL_COMMUNITY)
Admission: EM | Admit: 2021-04-16 | Discharge: 2021-04-16 | Disposition: A | Discharge status: Home / Self Care | Payer: Medicare Other | Attending: Emergency Medicine | Admitting: Emergency Medicine

## 2021-04-16 ENCOUNTER — Encounter (HOSPITAL_COMMUNITY): Payer: Self-pay | Admitting: *Deleted

## 2021-04-16 DIAGNOSIS — J90 Pleural effusion, not elsewhere classified: Secondary | ICD-10-CM | POA: Insufficient documentation

## 2021-04-16 DIAGNOSIS — R0789 Other chest pain: Secondary | ICD-10-CM | POA: Diagnosis not present

## 2021-04-16 DIAGNOSIS — R002 Palpitations: Secondary | ICD-10-CM | POA: Insufficient documentation

## 2021-04-16 DIAGNOSIS — Z85828 Personal history of other malignant neoplasm of skin: Secondary | ICD-10-CM | POA: Insufficient documentation

## 2021-04-16 DIAGNOSIS — R079 Chest pain, unspecified: Secondary | ICD-10-CM | POA: Diagnosis not present

## 2021-04-16 DIAGNOSIS — R7989 Other specified abnormal findings of blood chemistry: Secondary | ICD-10-CM | POA: Insufficient documentation

## 2021-04-16 DIAGNOSIS — Z7982 Long term (current) use of aspirin: Secondary | ICD-10-CM | POA: Diagnosis not present

## 2021-04-16 DIAGNOSIS — R072 Precordial pain: Secondary | ICD-10-CM

## 2021-04-16 DIAGNOSIS — N189 Chronic kidney disease, unspecified: Secondary | ICD-10-CM | POA: Diagnosis not present

## 2021-04-16 DIAGNOSIS — R000 Tachycardia, unspecified: Secondary | ICD-10-CM | POA: Insufficient documentation

## 2021-04-16 DIAGNOSIS — I4891 Unspecified atrial fibrillation: Secondary | ICD-10-CM | POA: Diagnosis not present

## 2021-04-16 DIAGNOSIS — R0689 Other abnormalities of breathing: Secondary | ICD-10-CM | POA: Diagnosis not present

## 2021-04-16 DIAGNOSIS — E039 Hypothyroidism, unspecified: Secondary | ICD-10-CM | POA: Diagnosis not present

## 2021-04-16 LAB — BASIC METABOLIC PANEL
Anion gap: 7 (ref 5–15)
BUN: 30 mg/dL — ABNORMAL HIGH (ref 8–23)
CO2: 25 mmol/L (ref 22–32)
Calcium: 8.8 mg/dL — ABNORMAL LOW (ref 8.9–10.3)
Chloride: 103 mmol/L (ref 98–111)
Creatinine, Ser: 0.99 mg/dL (ref 0.44–1.00)
GFR, Estimated: 53 mL/min — ABNORMAL LOW (ref >60)
Glucose, Bld: 115 mg/dL — ABNORMAL HIGH (ref 70–99)
Potassium: 4.4 mmol/L (ref 3.5–5.1)
Sodium: 135 mmol/L (ref 135–145)

## 2021-04-16 LAB — CBC
HCT: 44.7 % (ref 36.0–46.0)
Hemoglobin: 13.8 g/dL (ref 12.0–15.0)
MCH: 28.6 pg (ref 26.0–34.0)
MCHC: 30.9 g/dL (ref 30.0–36.0)
MCV: 92.5 fL (ref 80.0–100.0)
Platelets: 206 10*3/uL (ref 150–400)
RBC: 4.83 MIL/uL (ref 3.87–5.11)
RDW: 13.3 % (ref 11.5–15.5)
WBC: 8.8 10*3/uL (ref 4.0–10.5)
nRBC: 0 % (ref 0.0–0.2)

## 2021-04-16 LAB — TROPONIN I (HIGH SENSITIVITY)
Troponin I (High Sensitivity): 12 ng/L (ref <18)
Troponin I (High Sensitivity): 12 ng/L (ref <18)

## 2021-04-16 NOTE — Discharge Instructions (Signed)
Follow-up with your primary care doctor regarding the pleural effusion.  Also they can consider checking your thyroid status again.  Also make an appointment follow-up with cardiology due to the chest discomfort.  Return for any new or worse symptoms.  Return for shortness of breath return for fevers return for cough return for chest pain lasting 20 minutes or longer.

## 2021-04-16 NOTE — ED Triage Notes (Signed)
Chest pain earlier today with heart fluttering. Pain started when leaving to attend son's funeral. Denies pain at present

## 2021-04-16 NOTE — ED Notes (Signed)
RN aware of vitals

## 2021-04-16 NOTE — ED Provider Notes (Addendum)
[facility] EMERGENCY DEPARTMENT Provider Note   CSN: [id number] Arrival date & time: 04/16/21  1753     History  Chief Complaint  Patient presents with   Chest Pain    Sharon Scott is a 88 y.o. female.  Patient today was complaining of some palpitations some intermittent chest pain few times throughout the day nothing lasting 20 minutes nonstop.  Patient was an tender son's funeral today which was very stressful for her.  EMS was called out earlier they said they did not see anything significant abnormal.  But later patient then started complaining of chest pain again so family called EMS out again.  And family decided to bring her in for evaluation.  Patient states that since she has been back in the room which has now been for several hours she has had no chest discomfort.  Still has a little bit of feeling like her heart is going fast.  Past medical history significant for hypothyroidism.  She had a visit from her doctors just last week and everything checked out okay.  Known to have chronic kidney disease has had some basal cell carcinomas had irritable bowel syndrome with diarrhea no known cardiac disease not followed by cardiology.  No real shortness of breath with this no nausea or vomiting.  Discomfort was kind of upper sternal area.  Patient's oxygen saturations in the room are in the 90s.  Cardiac monitoring is showing a little bit of sinus tachycardia with a heart rate around 110.  Patient is now been on cardiac monitor for long period of time without any arrhythmias.      Home Medications Prior to Admission medications   Medication Sig Start Date End Date Taking? Authorizing Provider  aspirin 81 MG chewable tablet Chew 81 mg by mouth daily.   Yes [provider]  cholecalciferol (VITAMIN D) 1000 UNITS tablet Take 1,000 Units by mouth daily.   Yes [provider]  diclofenac Sodium (VOLTAREN) 1 % GEL 2-4 g 4 (four) times daily. 11/03/20  Yes [provider]  fluticasone (FLONASE) 50 MCG/ACT nasal spray Place 2 sprays into both nostrils daily. 12/19/18  Yes [provider]  gabapentin (NEURONTIN) 100 MG capsule Take 200 mg by mouth at bedtime. 05/13/20  Yes [provider]  meclizine (ANTIVERT) 25 MG tablet Take 1 tablet (25 mg total) by mouth 3 (three) times daily as needed for dizziness. 03/13/19  Yes Josue Hector, MD  Multiple Vitamins-Minerals (MULTIVITAMIN WITH MINERALS) tablet Take 1 tablet by mouth daily.   Yes [provider]  SYNTHROID 50 MCG tablet Take 50 mcg by mouth daily. 09/18/15  Yes [provider]  vitamin C (ASCORBIC ACID) 500 MG tablet Take 500 mg by mouth daily.   Yes [provider]      Allergies    Clindamycin/lincomycin and Amoxicillin    Review of Systems   Review of Systems  Constitutional:  Negative for chills and fever.  HENT:  Negative for ear pain and sore throat.   Eyes:  Negative for pain and visual disturbance.  Respiratory:  Negative for cough and shortness of breath.   Cardiovascular:  Positive for chest pain and palpitations.  Gastrointestinal:  Negative for abdominal pain and vomiting.  Genitourinary:  Negative for dysuria and hematuria.  Musculoskeletal:  Negative for arthralgias and back pain.  Skin:  Negative for color change and rash.  Neurological:  Negative for seizures and syncope.  All other systems reviewed and are negative.  Physical Exam Updated Vital Signs BP 103/60    Pulse (!) 109    Temp 97.8 F (36.6 C)    Resp (!) 21    SpO2 93%  Physical Exam Vitals and nursing note reviewed.  Constitutional:      General: She is not in acute distress.    Appearance: Normal appearance. She is well-developed.  HENT:     Head: Normocephalic and atraumatic.  Eyes:     Extraocular Movements: Extraocular movements intact.     Conjunctiva/sclera: Conjunctivae normal.     Pupils: Pupils are equal, round, and reactive to light.   Cardiovascular:     Rate and Rhythm: Regular rhythm. Tachycardia present.     Heart sounds: No murmur heard. Pulmonary:     Effort: Pulmonary effort is normal. No respiratory distress.     Breath sounds: Normal breath sounds. No wheezing, rhonchi or rales.  Chest:     Chest wall: No tenderness.  Abdominal:     Palpations: Abdomen is soft.     Tenderness: There is no abdominal tenderness.  Musculoskeletal:        General: No swelling.     Cervical back: Normal range of motion and neck supple.     Right lower leg: No edema.     Left lower leg: No edema.  Skin:    General: Skin is warm and dry.     Capillary Refill: Capillary refill takes less than 2 seconds.  Neurological:     General: No focal deficit present.     Mental Status: She is alert and oriented to person, place, and time.  Psychiatric:        Mood and Affect: Mood normal.    ED Results / Procedures / Treatments   Labs (all labs ordered are listed, but only abnormal results are displayed) Labs Reviewed  BASIC METABOLIC PANEL - Abnormal; Notable for the following components:      Result Value   Glucose, Bld 115 (*)    BUN 30 (*)    Calcium 8.8 (*)    GFR, Estimated 53 (*)    All other components within normal limits  CBC  TROPONIN I (HIGH SENSITIVITY)  TROPONIN I (HIGH SENSITIVITY)    EKG EKG Interpretation  Date/Time:  Thursday April 16 2021 18:04:09 EST Ventricular Rate:  112 PR Interval:  174 QRS Duration: 75 QT Interval:  327 QTC Calculation: 447 R Axis:   46 Text Interpretation: Sinus tachycardia Anterior infarct, old Minimal ST depression, lateral leads Confirmed by Fredia Sorrow [phone]) on 04/16/2021 11:18:01 PM  Radiology DG Chest 2 View  Result Date: 04/16/2021 CLINICAL DATA:  Chest pain. EXAM: CHEST - 2 VIEW COMPARISON:  Radiograph 12/31/2019 FINDINGS: Moderate left pleural effusion occupying the lower 1/3 of left hemithorax with associated basilar atelectasis and/or airspace disease.  There is no right pleural effusion. Heart is normal in size. Aortic atherosclerosis. Biapical pleuroparenchymal scarring. No pulmonary edema. No pneumothorax. Bony under mineralization without acute osseous findings. IMPRESSION: Moderate left pleural effusion with associated basilar atelectasis and/or airspace disease. Electronically Signed   By: Keith Rake M.D.   On: 04/16/2021 18:58    Procedures Procedures    Medications Ordered in ED Medications - No data to display  ED Course/ Medical Decision Making/ A&P                           Medical Decision Making Amount and/or Complexity of Data Reviewed Labs: ordered.  Radiology: ordered.   Patient's troponins x2 have been normal.  No significant delta change.  Basic metabolic panel potassium 4.4 BUN 30 creatinine 0.99.  CBC no leukocytosis hemoglobin good at 13.8.  Chest x-ray moderate pleural left pleural effusion with associated bio basilar atelectasis and/or airspace disease.  But patient really does not have any acute respiratory type symptoms.  No fever no leukocytosis.  No hypoxia.  In addition cardiac monitoring while patient is been back in the room is just been a sinus tach.  No arrhythmias.  Patient's EKG without any significant changes from previous EKGs.  We will have patient follow-up with Dr. [place] Crane regarding the pleural effusion.  And will have patient also follow-up with cardiology.   Final Clinical Impression(s) / ED Diagnoses Final diagnoses:  Precordial pain  Pleural effusion on left    Rx / DC Orders ED Discharge Orders     None         Fredia Sorrow, MD 04/16/21 9450    Fredia Sorrow, MD 04/16/21 2325

## 2021-04-16 NOTE — ED Notes (Signed)
RN is aware of pts vitals

## 2021-04-16 NOTE — ED Notes (Signed)
Went over d/c papers. Wheeled to lobby by grandson.

## 2021-04-20 DIAGNOSIS — R079 Chest pain, unspecified: Secondary | ICD-10-CM | POA: Diagnosis not present

## 2021-04-20 DIAGNOSIS — J069 Acute upper respiratory infection, unspecified: Secondary | ICD-10-CM | POA: Diagnosis not present

## 2021-04-20 DIAGNOSIS — J90 Pleural effusion, not elsewhere classified: Secondary | ICD-10-CM | POA: Diagnosis not present

## 2021-04-20 DIAGNOSIS — F410 Panic disorder [episodic paroxysmal anxiety] without agoraphobia: Secondary | ICD-10-CM | POA: Diagnosis not present

## 2021-04-28 ENCOUNTER — Encounter: Payer: Self-pay | Admitting: Emergency Medicine

## 2021-04-28 ENCOUNTER — Ambulatory Visit
Admission: EM | Admit: 2021-04-28 | Discharge: 2021-04-28 | Disposition: A | Discharge status: Home / Self Care | Payer: Medicare Other | Attending: Family Medicine | Admitting: Family Medicine

## 2021-04-28 ENCOUNTER — Ambulatory Visit (INDEPENDENT_AMBULATORY_CARE_PROVIDER_SITE_OTHER): Payer: Medicare Other

## 2021-04-28 ENCOUNTER — Other Ambulatory Visit: Payer: Self-pay

## 2021-04-28 DIAGNOSIS — J90 Pleural effusion, not elsewhere classified: Secondary | ICD-10-CM

## 2021-04-28 DIAGNOSIS — J9811 Atelectasis: Secondary | ICD-10-CM | POA: Diagnosis not present

## 2021-04-28 DIAGNOSIS — J449 Chronic obstructive pulmonary disease, unspecified: Secondary | ICD-10-CM | POA: Diagnosis not present

## 2021-04-28 DIAGNOSIS — R051 Acute cough: Secondary | ICD-10-CM

## 2021-04-28 DIAGNOSIS — R059 Cough, unspecified: Secondary | ICD-10-CM | POA: Diagnosis not present

## 2021-04-28 MED ORDER — ALBUTEROL SULFATE HFA 108 (90 BASE) MCG/ACT IN AERS: 1.0000 | INHALATION_SPRAY | Freq: Four times a day (QID) | RESPIRATORY_TRACT | 0 refills | Status: AC | PRN

## 2021-04-28 MED ORDER — PREDNISONE 20 MG PO TABS: 40.0000 mg | ORAL_TABLET | Freq: Every day | ORAL | 0 refills | Status: AC

## 2021-04-28 NOTE — ED Triage Notes (Signed)
Pt family reports pt was seen for same on 04/20/21 and reports was prescribed doxycycline and reports no change in symptoms. Negative home covid tests x2. Pt was also seen in ED for chest pain and diagnosed with pleurisy.  ?

## 2021-05-02 NOTE — ED Provider Notes (Signed)
[facility]    CSN: [id number] Arrival date & time: 04/28/21  1011      History   Chief Complaint Chief Complaint  Patient presents with   Cough    HPI Sharon Scott is a 88 y.o. female.   Presenting today with 1 month of ongoing chest discomfort with coughing. Denies SOB, wheezing, fever, chills, congestion, abdominal pain, N/V/D. Was seen in ED for same and after workup was diagnosed with pleurisy and left pleural effusion. Has had neg home COVID testing and has tried a course of doxycycline with no benefit in sxs. No known chronic pulmonary disease.    Past Medical History:  Diagnosis Date   Age-related osteoporosis without current pathological fracture    BCC (basal cell carcinoma of skin) 10/11/1996   Left Lower Back (curet and 5FU)   BCC (basal cell carcinoma of skin) 01/15/1997   Right Nasal Tip (curet and 5FU)   BCC (basal cell carcinoma of skin) 08/02/2007   Crown of Scalp (MOH's)   BCC (basal cell carcinoma of skin) 07/27/2011   Left Preauricular (curet and 5FU)   Cancer ([place])    skin   Chronic kidney disease, stage II (mild)    Chronic kidney disease, unspecified    Dysphagia, oropharyngeal phase    Hyperlipidemia, unspecified    Hypothyroidism, unspecified    Irritable bowel syndrome with diarrhea    Nodular basal cell carcinoma (BCC) 05/20/2015   Right Tip Nose (MOH's)   OAB (overactive bladder)    SCCA (squamous cell carcinoma) of skin 08/02/2007   Right Arm Lateral (curet and 5FU)   SCCA (squamous cell carcinoma) of skin 08/02/2007   Right Arm Medial (curet and 5FU)   SCCA (squamous cell carcinoma) of skin 08/02/2007   Right Thigh (curet and 5FU)   SCCA (squamous cell carcinoma) of skin 03/06/2013   Left Forearm (in situ) (tx p bx)   SCCA (squamous cell carcinoma) of skin 03/26/2014   Right Bicep (in situ) (tx p bx)   SCCA (squamous cell carcinoma) of skin 12/16/2015   Left Mid Back (Bowen's) (tx p bx)   SCCA (squamous cell carcinoma)  of skin 03/30/2016   Right Hand (in situ) (tx p bx)   Shingles 1990   Superficial basal cell carcinoma (BCC) 09/29/2016   Left Outer Forehead (curet and 5FU)   Superficial nodular basal cell carcinoma (BCC) 08/03/2017   Left Sideburn (MOH's)   Thyroid disease    hypothyroid   Unspecified osteoarthritis, unspecified site     Patient Active Problem List   Diagnosis Date Noted   Dizziness 02/20/2019   Hyperkalemia 02/20/2019   Elevated glucose 02/20/2019   Leukocytes in urine 02/20/2019   Age-related osteoporosis without current pathological fracture    Hyperlipidemia, unspecified    Hypothyroidism, unspecified    Chronic kidney disease, stage II (mild)    Chronic kidney disease, unspecified    Dysphagia, oropharyngeal phase    OAB (overactive bladder)    Unspecified osteoarthritis, unspecified site    CLOSED BIMALLEOLAR FRACTURE 12/31/2009    Past Surgical History:  Procedure Laterality Date   ABDOMINAL HYSTERECTOMY  1968   ANKLE SURGERY Right 2011   plate and 7 screws   FRACTURE SURGERY      OB History   No obstetric history on file.      Home Medications    Prior to Admission medications   Medication Sig Start Date End Date Taking? Authorizing Provider  albuterol (VENTOLIN HFA) 108 (90 Base)  MCG/ACT inhaler Inhale 1-2 puffs into the lungs every 6 (six) hours as needed for wheezing or shortness of breath. 04/28/21  Yes Volney American, PA-C  gabapentin (NEURONTIN) 100 MG capsule Take 200 mg by mouth at bedtime. 05/13/20  Yes [provider]  predniSONE (DELTASONE) 20 MG tablet Take 2 tablets (40 mg total) by mouth daily with breakfast. 04/28/21  Yes Volney American, PA-C  SYNTHROID 50 MCG tablet Take 50 mcg by mouth daily. 09/18/15  Yes [provider]  aspirin 81 MG chewable tablet Chew 81 mg by mouth daily.    [provider]  cholecalciferol (VITAMIN D) 1000 UNITS tablet Take 1,000 Units by mouth daily.    [provider]  diclofenac Sodium (VOLTAREN) 1 % GEL 2-4 g 4 (four) times daily. 11/03/20   [provider]  fluticasone (FLONASE) 50 MCG/ACT nasal spray Place 2 sprays into both nostrils daily. 12/19/18   [provider]  meclizine (ANTIVERT) 25 MG tablet Take 1 tablet (25 mg total) by mouth 3 (three) times daily as needed for dizziness. 03/13/19   Josue Hector, MD  Multiple Vitamins-Minerals (MULTIVITAMIN WITH MINERALS) tablet Take 1 tablet by mouth daily.    [provider]  vitamin C (ASCORBIC ACID) 500 MG tablet Take 500 mg by mouth daily.    [provider]    Family History Family History  Problem Relation Age of Onset   Healthy Mother    Healthy Father    Stroke Father     Social History Social History   Tobacco Use   Smoking status: Never   Smokeless tobacco: Never  Vaping Use   Vaping Use: Never used  Substance Use Topics   Alcohol use: No   Drug use: No     Allergies   Clindamycin/lincomycin and Amoxicillin   Review of Systems Review of Systems PER HPI  Physical Exam Triage Vital Signs ED Triage Vitals  Enc Vitals Group     BP 04/28/21 1212 (!) 141/79     Pulse Rate 04/28/21 1212 100     Resp 04/28/21 1212 18     Temp 04/28/21 1212 97.8 F (36.6 C)     Temp Source 04/28/21 1212 Oral     SpO2 04/28/21 1212 93 %     Weight 04/28/21 1213 123 lb (55.8 kg)     Height 04/28/21 1213 '5\' 7"'$  (1.702 m)     Head Circumference --      Peak Flow --      Pain Score 04/28/21 1212 3     Pain Loc --      Pain Edu? --      Excl. in [place]? --    No data found.  Updated Vital Signs BP (!) 141/79 (BP Location: Right Arm)    Pulse 100    Temp 97.8 F (36.6 C) (Oral)    Resp 18    Ht '5\' 7"'$  (1.702 m)    Wt 123 lb (55.8 kg)    SpO2 93%    BMI 19.26 kg/m   Visual Acuity Right Eye Distance:   Left Eye Distance:   Bilateral Distance:    Right Eye Near:   Left Eye Near:    Bilateral Near:     Physical Exam Vitals and nursing note reviewed.   Constitutional:      Appearance: Normal appearance. She is not ill-appearing.  HENT:     Head: Atraumatic.  Eyes:     Extraocular Movements:  Extraocular movements intact.     Conjunctiva/sclera: Conjunctivae normal.  Cardiovascular:     Rate and Rhythm: Normal rate and regular rhythm.     Heart sounds: Normal heart sounds.  Pulmonary:     Effort: Pulmonary effort is normal.     Breath sounds: Normal breath sounds. No wheezing.     Comments: Slight crackles left lower lung field and decreased breath sounds Musculoskeletal:        General: Normal range of motion.     Cervical back: Normal range of motion and neck supple.  Skin:    General: Skin is warm and dry.  Neurological:     Mental Status: She is alert and oriented to person, place, and time.  Psychiatric:        Mood and Affect: Mood normal.        Thought Content: Thought content normal.        Judgment: Judgment normal.     UC Treatments / Results  Labs (all labs ordered are listed, but only abnormal results are displayed) Labs Reviewed - No data to display  EKG   Radiology No results found.  Procedures Procedures (including critical care time)  Medications Ordered in UC Medications - No data to display  Initial Impression / Assessment and Plan / UC Course  I have reviewed the triage vital signs and the nursing notes.  Pertinent labs & imaging results that were available during my care of the patient were reviewed by me and considered in my medical decision making (see chart for details).     Overall very well appearing with stable vital signs, CXR showing mildly improved effusion of the left lung and no other acute cardiopulmonary abnormality. Will treat with short course of prednisone for suspected bronchitis type illness and continue supportive OTC medication and home care. Strict ED precautions for worsening sxs.   Final Clinical Impressions(s) / UC Diagnoses   Final diagnoses:  Acute cough   Pleural effusion, left   Discharge Instructions   None    ED Prescriptions     Medication Sig Dispense Auth. Provider   predniSONE (DELTASONE) 20 MG tablet Take 2 tablets (40 mg total) by mouth daily with breakfast. 10 tablet Volney American, PA-C   albuterol (VENTOLIN HFA) 108 (90 Base) MCG/ACT inhaler Inhale 1-2 puffs into the lungs every 6 (six) hours as needed for wheezing or shortness of breath. 18 g Volney American, [place]      PDMP not reviewed this encounter.   Volney American, [place] 05/02/21 2255

## 2021-05-20 ENCOUNTER — Encounter (HOSPITAL_BASED_OUTPATIENT_CLINIC_OR_DEPARTMENT_OTHER): Payer: Self-pay | Admitting: General Practice

## 2021-05-20 ENCOUNTER — Ambulatory Visit (INDEPENDENT_AMBULATORY_CARE_PROVIDER_SITE_OTHER): Payer: Medicare Other | Admitting: General Practice

## 2021-05-20 ENCOUNTER — Other Ambulatory Visit: Payer: Self-pay

## 2021-05-20 VITALS — BP 124/72 | HR 118 | Ht 65.0 in | Wt 118.0 lb

## 2021-05-20 DIAGNOSIS — R000 Tachycardia, unspecified: Secondary | ICD-10-CM

## 2021-05-20 DIAGNOSIS — R072 Precordial pain: Secondary | ICD-10-CM | POA: Diagnosis not present

## 2021-05-20 DIAGNOSIS — J90 Pleural effusion, not elsewhere classified: Secondary | ICD-10-CM | POA: Diagnosis not present

## 2021-05-20 NOTE — Patient Instructions (Signed)
Medication Instructions:  ?Your Physician recommend you continue on your current medication as directed.   ? ?*If you need a refill on your cardiac medications before your next appointment, please call your pharmacy* ? ?Follow-Up: ?At [facility], you and your health needs are our priority.  As part of our continuing mission to provide you with exceptional heart care, we have created designated Provider Care Teams.  These Care Teams include your primary Cardiologist (physician) and Advanced Practice Providers (APPs -  Physician Assistants and Nurse Practitioners) who all work together to provide you with the care you need, when you need it. ? ?We recommend signing up for the patient portal called "MyChart".  Sign up information is provided on this After Visit Summary.  MyChart is used to connect with patients for Virtual Visits (Telemedicine).  Patients are able to view lab/test results, encounter notes, upcoming appointments, etc.  Non-urgent messages can be sent to your provider as well.   ?To learn more about what you can do with MyChart, go to [email or web address].   ? ?Your next appointment:   ?Call us if you need [place]! ? ?Other Instructions ?Exercise recommendations: ?The American Heart Association recommends 150 minutes of moderate intensity exercise weekly. ?Try 30 minutes of moderate intensity exercise 4-5 times per week. ?This could include walking, jogging, or swimming. ? ? ? ? ?

## 2021-05-20 NOTE — Progress Notes (Signed)
? ?Cardiology Office Note:   ? ?Date:  05/20/2021  ? ?ID:  AHSHA HINSLEY, DOB 04-05-1924, MRN [id number] ? ?PCP:  Celene Squibb, MD ?  ?[facility] Providers ?Cardiologist:  None  ?   ? ?Referring MD: Celene Squibb, MD  ? ?Follow-up for chest discomfort ? ?History of Present Illness:   ? ?Sharon Scott is a 88 y.o. female with a hx of chest pain, hypothyroidism, CKD stage II, overactive bladder, dizziness, hyperlipidemia, and elevated glucose. ? ?She was seen in the emergency department on 04/16/2021.  She reported palpitations and intermittent chest discomfort that happened a few times throughout the day.  The discomfort had lasted for about 20 minutes and was continuous.  She had been had a funeral which was very stressful.  EMS was contacted, and did not note anything abnormal.  However, later family decided to bring her in for evaluation.  On evaluation in the emergency department she did not have chest discomfort.  She did however feel like her heart was racing.  She reported that she has been to her doctor the previous week and everything checked out okay.  She is now followed by cardiology.  She denies shortness of breath, nausea, vomiting.  She described her pain as a upper sternal area pain.  Her oxygen saturation was stable in the 90s.  She was noted to have some sinus tachycardia with rates in the 110 range.  No arrhythmias were noted.  Her BMP showed a glucose of 115, BUN 30 EGFR 53 CBC was unremarkable.  Her high-sensitivity troponins were low and flat.  Her chest x-ray showed moderate left pleural effusion with associated basilar atelectasis and/or airspace disease. ? ?She presents to the clinic today for follow-up evaluation states she feels well.  She has had no further episodes of chest discomfort.  She presents with a caregiver.  She informs me that she occasionally uses a stationary peddler for exercise but has not been as active recently.  She drinks water and Gatorade for hydration.  She had a  follow-up appointment at urgent care after being to the emergency department.  A chest x-ray was repeated and showed improving pleural effusion.  We reviewed her labs and diagnostic tests from the emergency department.  They expressed understanding.  She does note increased work of breathing with increased physical activity and increased heart rate with increased physical activity.  I have asked her to increase her physical activity as tolerated, will give her the salty 6 diet sheet, have her follow-up as needed. ? ?Today she denies chest pain, shortness of breath, lower extremity edema, fatigue, palpitations, melena, hematuria, hemoptysis, diaphoresis, weakness, presyncope, syncope, orthopnea, and PND. ? ? ?Past Medical History:  ?Diagnosis Date  ? Age-related osteoporosis without current pathological fracture   ? BCC (basal cell carcinoma of skin) 10/11/1996  ? Left Lower Back (curet and 5FU)  ? BCC (basal cell carcinoma of skin) 01/15/1997  ? Right Nasal Tip (curet and 5FU)  ? BCC (basal cell carcinoma of skin) 08/02/2007  ? Crown of Scalp (MOH's)  ? BCC (basal cell carcinoma of skin) 07/27/2011  ? Left Preauricular (curet and 5FU)  ? Cancer [facility])   ? skin  ? Chronic kidney disease, stage II (mild)   ? Chronic kidney disease, unspecified   ? Dysphagia, oropharyngeal phase   ? Hyperlipidemia, unspecified   ? Hypothyroidism, unspecified   ? Irritable bowel syndrome with diarrhea   ? Nodular basal cell carcinoma (BCC) 05/20/2015  ?  Right Tip Nose (MOH's)  ? OAB (overactive bladder)   ? SCCA (squamous cell carcinoma) of skin 08/02/2007  ? Right Arm Lateral (curet and 5FU)  ? SCCA (squamous cell carcinoma) of skin 08/02/2007  ? Right Arm Medial (curet and 5FU)  ? SCCA (squamous cell carcinoma) of skin 08/02/2007  ? Right Thigh (curet and 5FU)  ? SCCA (squamous cell carcinoma) of skin 03/06/2013  ? Left Forearm (in situ) (tx p bx)  ? SCCA (squamous cell carcinoma) of skin 03/26/2014  ? Right Bicep (in situ) (tx p bx)   ? SCCA (squamous cell carcinoma) of skin 12/16/2015  ? Left Mid Back (Bowen's) (tx p bx)  ? SCCA (squamous cell carcinoma) of skin 03/30/2016  ? Right Hand (in situ) (tx p bx)  ? Shingles 1990  ? Superficial basal cell carcinoma (BCC) 09/29/2016  ? Left Outer Forehead (curet and 5FU)  ? Superficial nodular basal cell carcinoma (BCC) 08/03/2017  ? Left [facility])  ? Thyroid disease   ? hypothyroid  ? Unspecified osteoarthritis, unspecified site   ? ? ?Past Surgical History:  ?Procedure Laterality Date  ? ABDOMINAL HYSTERECTOMY  1968  ? ANKLE SURGERY Right 2011  ? plate and 7 screws  ? FRACTURE SURGERY    ? ? ?Current Medications: ?Current Meds  ?Medication Sig  ? albuterol (VENTOLIN HFA) 108 (90 Base) MCG/ACT inhaler Inhale 1-2 puffs into the lungs every 6 (six) hours as needed for wheezing or shortness of breath.  ? aspirin 81 MG chewable tablet Chew 81 mg by mouth daily.  ? cholecalciferol (VITAMIN D) 1000 UNITS tablet Take 1,000 Units by mouth daily.  ? diclofenac Sodium (VOLTAREN) 1 % GEL 2-4 g 4 (four) times daily.  ? fluticasone (FLONASE) 50 MCG/ACT nasal spray Place 2 sprays into both nostrils daily.  ? gabapentin (NEURONTIN) 100 MG capsule Take 200 mg by mouth at bedtime.  ? meclizine (ANTIVERT) 25 MG tablet Take 1 tablet (25 mg total) by mouth 3 (three) times daily as needed for dizziness.  ? Multiple Vitamins-Minerals (MULTIVITAMIN WITH MINERALS) tablet Take 1 tablet by mouth daily.  ? predniSONE (DELTASONE) 20 MG tablet Take 2 tablets (40 mg total) by mouth daily with breakfast.  ? SYNTHROID 50 MCG tablet Take 50 mcg by mouth daily.  ? vitamin C (ASCORBIC ACID) 500 MG tablet Take 500 mg by mouth daily.  ?  ? ?Allergies:   Clindamycin/lincomycin and Amoxicillin  ? ?Social History  ? ?Socioeconomic History  ? Marital status: Widowed  ?  Spouse name: Not on file  ? Number of children: Not on file  ? Years of education: Not on file  ? Highest education level: Not on file  ?Occupational History  ?  Occupation: retired  ?Tobacco Use  ? Smoking status: Never  ? Smokeless tobacco: Never  ?Vaping Use  ? Vaping Use: Never used  ?Substance and Sexual Activity  ? Alcohol use: No  ? Drug use: No  ? Sexual activity: Not Currently  ?Other Topics Concern  ? Not on file  ?Social History Narrative  ? Not on file  ? ?Social Determinants of Health  ? ?Financial Resource Strain: Not on file  ?Food Insecurity: Not on file  ?Transportation Needs: Not on file  ?Physical Activity: Not on file  ?Stress: Not on file  ?Social Connections: Not on file  ?  ? ?Family History: ?The patient's family history includes Healthy in her father and mother; Stroke in her father. ? ?ROS:   ?Please see  the history of present illness.    ? All other systems reviewed and are negative. ? ? ?Risk Assessment/Calculations:   ?  ? ?    ? ?Physical Exam:   ? ?VS:  BP 124/72   Pulse (!) 118   Ht _0  (1.651 m)   Wt 118 lb (53.5 kg)   SpO2 93%   BMI 19.64 kg/m?    ? ?Wt Readings from Last 3 Encounters:  ?05/20/21 118 lb (53.5 kg)  ?04/28/21 123 lb (55.8 kg)  ?07/10/20 110 lb (49.9 kg)  ?  ? ?GEN:  Well nourished, well developed in no acute distress ?HEENT: Normal ?NECK: No JVD; No carotid bruits ?LYMPHATICS: No lymphadenopathy ?CARDIAC: RRR, no murmurs, rubs, gallops ?RESPIRATORY:  Clear to auscultation without rales, wheezing or rhonchi  ?ABDOMEN: Soft, non-tender, non-distended ?MUSCULOSKELETAL:  No edema; No deformity  ?SKIN: Warm and dry ?NEUROLOGIC:  Alert and oriented x 3 ?PSYCHIATRIC:  Normal affect  ? ? ?EKGs/Labs/Other Studies Reviewed:   ? ? ? ?EKG: None today. ? ?Recent Labs: ?04/16/2021: BUN 30; Creatinine, Ser 0.99; Hemoglobin 13.8; Platelets 206; Potassium 4.4; Sodium 135  ?Recent Lipid Panel ?   ?Component Value Date/Time  ? CHOL 192 12/12/2018 0955  ? TRIG 79 12/12/2018 0955  ? HDL 87 (A) 12/12/2018 0955  ? [place] 91 12/12/2018 0955  ? ? ?ASSESSMENT & PLAN  ? ? ?Precordial pain-no further episodes of chest discomfort.  Presented to the  emergency department 04/16/2021 after being to a funeral.  High-sensitivity troponins were low and flat.  Chest x-ray showed moderate left pleural effusion.  EKG showed sinus tachycardia anterior infarct, old 112 b

## 2021-07-06 ENCOUNTER — Ambulatory Visit
Admission: EM | Admit: 2021-07-06 | Discharge: 2021-07-06 | Disposition: A | Discharge status: Home / Self Care | Payer: Medicare Other | Attending: Family Medicine | Admitting: Family Medicine

## 2021-07-06 ENCOUNTER — Ambulatory Visit (INDEPENDENT_AMBULATORY_CARE_PROVIDER_SITE_OTHER): Payer: Medicare Other

## 2021-07-06 DIAGNOSIS — J90 Pleural effusion, not elsewhere classified: Secondary | ICD-10-CM

## 2021-07-06 DIAGNOSIS — R0602 Shortness of breath: Secondary | ICD-10-CM | POA: Diagnosis not present

## 2021-07-06 DIAGNOSIS — R000 Tachycardia, unspecified: Secondary | ICD-10-CM | POA: Diagnosis not present

## 2021-07-06 DIAGNOSIS — J9811 Atelectasis: Secondary | ICD-10-CM | POA: Diagnosis not present

## 2021-07-06 DIAGNOSIS — R059 Cough, unspecified: Secondary | ICD-10-CM | POA: Diagnosis not present

## 2021-07-06 NOTE — Discharge Instructions (Signed)
Follow up with your primary care provider as soon as possible.  Go to the emergency department if your symptoms worsen at any time. ?

## 2021-07-06 NOTE — ED Triage Notes (Signed)
Pt states she has had some congestion with a cough for a few months but it is now worse ? ?Pt states she is having some SOB ? ?Pt states she has been taking Mucinex without any relief ? ?Denies Fever ?

## 2021-07-06 NOTE — ED Provider Notes (Signed)
?[facility] ? ? ? ?CSN: [id number] ?Arrival date & time: 07/06/21  1315 ? ? ?  ? ?History   ?Chief Complaint ?Chief Complaint  ?Patient presents with  ? Cough  ?  SOB and cough  ? ? ?HPI ?Sharon Scott is a 88 y.o. female.  ? ?Patient presenting today with ongoing issues with cough, shortness of breath that have worsened over the past week or so.  The shortness of breath seems to be worse particularly with exertion.  Denies fever, chills, nasal congestion, sore throat, chest pain, abdominal pain, nausea vomiting or diarrhea.  Pending members who are with her today states that her home pulse oximetry has been between 93 and 96% on room air.  Was found in the past few months to have a left pleural effusion that appeared to be improving over time so no action has been taken on this front.  Takes Mucinex twice daily with no relief of the congestion and cough.  No known history of chronic pulmonary disease.  Non-smoker. ? ? ?Past Medical History:  ?Diagnosis Date  ? Age-related osteoporosis without current pathological fracture   ? BCC (basal cell carcinoma of skin) 10/11/1996  ? Left Lower Back (curet and 5FU)  ? BCC (basal cell carcinoma of skin) 01/15/1997  ? Right Nasal Tip (curet and 5FU)  ? BCC (basal cell carcinoma of skin) 08/02/2007  ? Crown of Scalp (MOH's)  ? BCC (basal cell carcinoma of skin) 07/27/2011  ? Left Preauricular (curet and 5FU)  ? Cancer [facility])   ? skin  ? Chronic kidney disease, stage II (mild)   ? Chronic kidney disease, unspecified   ? Dysphagia, oropharyngeal phase   ? Hyperlipidemia, unspecified   ? Hypothyroidism, unspecified   ? Irritable bowel syndrome with diarrhea   ? Nodular basal cell carcinoma (BCC) 05/20/2015  ? Right Tip Nose (MOH's)  ? OAB (overactive bladder)   ? SCCA (squamous cell carcinoma) of skin 08/02/2007  ? Right Arm Lateral (curet and 5FU)  ? SCCA (squamous cell carcinoma) of skin 08/02/2007  ? Right Arm Medial (curet and 5FU)  ? SCCA (squamous cell carcinoma)  of skin 08/02/2007  ? Right Thigh (curet and 5FU)  ? SCCA (squamous cell carcinoma) of skin 03/06/2013  ? Left Forearm (in situ) (tx p bx)  ? SCCA (squamous cell carcinoma) of skin 03/26/2014  ? Right Bicep (in situ) (tx p bx)  ? SCCA (squamous cell carcinoma) of skin 12/16/2015  ? Left Mid Back (Bowen's) (tx p bx)  ? SCCA (squamous cell carcinoma) of skin 03/30/2016  ? Right Hand (in situ) (tx p bx)  ? Shingles 1990  ? Superficial basal cell carcinoma (BCC) 09/29/2016  ? Left Outer Forehead (curet and 5FU)  ? Superficial nodular basal cell carcinoma (BCC) 08/03/2017  ? Left [facility])  ? Thyroid disease   ? hypothyroid  ? Unspecified osteoarthritis, unspecified site   ? ? ?Patient Active Problem List  ? Diagnosis Date Noted  ? Dizziness 02/20/2019  ? Hyperkalemia 02/20/2019  ? Elevated glucose 02/20/2019  ? Leukocytes in urine 02/20/2019  ? Age-related osteoporosis without current pathological fracture   ? Hyperlipidemia, unspecified   ? Hypothyroidism, unspecified   ? Chronic kidney disease, stage II (mild)   ? Chronic kidney disease, unspecified   ? Dysphagia, oropharyngeal phase   ? OAB (overactive bladder)   ? Unspecified osteoarthritis, unspecified site   ? CLOSED BIMALLEOLAR FRACTURE 12/31/2009  ? ? ?Past Surgical History:  ?Procedure Laterality  Date  ? ABDOMINAL HYSTERECTOMY  1968  ? ANKLE SURGERY Right 2011  ? plate and 7 screws  ? FRACTURE SURGERY    ? ? ?OB History   ?No obstetric history on file. ?  ? ? ? ?Home Medications   ? ?Prior to Admission medications   ?Medication Sig Start Date End Date Taking? Authorizing Provider  ?albuterol (VENTOLIN HFA) 108 (90 Base) MCG/ACT inhaler Inhale 1-2 puffs into the lungs every 6 (six) hours as needed for wheezing or shortness of breath. 04/28/21   Volney American, PA-C  ?aspirin 81 MG chewable tablet Chew 81 mg by mouth daily.    [provider]  ?cholecalciferol (VITAMIN D) 1000 UNITS tablet Take 1,000 Units by mouth daily.    [provider]  ?diclofenac Sodium (VOLTAREN) 1 % GEL 2-4 g 4 (four) times daily. 11/03/20   [provider]  ?fluticasone (FLONASE) 50 MCG/ACT nasal spray Place 2 sprays into both nostrils daily. 12/19/18   [provider]  ?gabapentin (NEURONTIN) 100 MG capsule Take 200 mg by mouth at bedtime. 05/13/20   [provider]  ?meclizine (ANTIVERT) 25 MG tablet Take 1 tablet (25 mg total) by mouth 3 (three) times daily as needed for dizziness. 03/13/19   Josue Hector, MD  ?Multiple Vitamins-Minerals (MULTIVITAMIN WITH MINERALS) tablet Take 1 tablet by mouth daily.    [provider]  ?predniSONE (DELTASONE) 20 MG tablet Take 2 tablets (40 mg total) by mouth daily with breakfast. 04/28/21   Volney American, PA-C  ?SYNTHROID 50 MCG tablet Take 50 mcg by mouth daily. 09/18/15   [provider]  ?vitamin C (ASCORBIC ACID) 500 MG tablet Take 500 mg by mouth daily.    [provider]  ? ? ?Family History ?Family History  ?Problem Relation Age of Onset  ? Healthy Mother   ? Healthy Father   ? Stroke Father   ? ? ?Social History ?Social History  ? ?Tobacco Use  ? Smoking status: Never  ? Smokeless tobacco: Never  ?Vaping Use  ? Vaping Use: Never used  ?Substance Use Topics  ? Alcohol use: No  ? Drug use: No  ? ? ? ?Allergies   ?Clindamycin/lincomycin and Amoxicillin ? ? ?Review of Systems ?Review of Systems ?HPI ? ?Physical Exam ?Triage Vital Signs ?ED Triage Vitals  ?Enc Vitals Group  ?   BP 07/06/21 1426 119/73  ?   Pulse Rate 07/06/21 1426 (!) 104  ?   Resp 07/06/21 1426 16  ?   Temp 07/06/21 1426 98.8 ?F (37.1 ?C)  ?   Temp Source 07/06/21 1426 Oral  ?   SpO2 07/06/21 1426 92 %  ?   Weight --   ?   Height --   ?   Head Circumference --   ?   Peak Flow --   ?   Pain Score 07/06/21 1424 8  ?   Pain Loc --   ?   Pain Edu? --   ?   Excl. in [place]? --   ? ?No data found. ? ?Updated Vital Signs ?BP 119/73 (BP Location: Left Arm)   Pulse (!) 104   Temp 98.8 ?F (37.1 ?C)  (Oral)   Resp 16   SpO2 92%  ? ?Visual Acuity ?Right Eye Distance:   ?Left Eye Distance:   ?Bilateral Distance:   ? ?Right Eye Near:   ?Left Eye Near:    ?Bilateral Near:    ? ?Physical Exam ?Vitals  and nursing note reviewed.  ?Constitutional:   ?   Appearance: Normal appearance. She is not ill-appearing.  ?HENT:  ?   Head: Atraumatic.  ?   Nose: Nose normal.  ?   Mouth/Throat:  ?   Mouth: Mucous membranes are moist.  ?Eyes:  ?   Extraocular Movements: Extraocular movements intact.  ?   Conjunctiva/sclera: Conjunctivae normal.  ?Cardiovascular:  ?   Rate and Rhythm: Normal rate and regular rhythm.  ?   Heart sounds: Normal heart sounds.  ?Pulmonary:  ?   Effort: Pulmonary effort is normal.  ?   Breath sounds: Normal breath sounds. No wheezing.  ?Musculoskeletal:     ?   General: Normal range of motion.  ?   Cervical back: Normal range of motion and neck supple.  ?Skin: ?   General: Skin is warm and dry.  ?Neurological:  ?   Mental Status: She is alert and oriented to person, place, and time. Mental status is at baseline.  ?Psychiatric:     ?   Mood and Affect: Mood normal.     ?   Thought Content: Thought content normal.     ?   Judgment: Judgment normal.  ? ? ? ?UC Treatments / Results  ?Labs ?(all labs ordered are listed, but only abnormal results are displayed) ?Labs Reviewed - No data to display ? ?EKG ? ? ?Radiology ?DG Chest 2 View ? ?Result Date: 07/06/2021 ?CLINICAL DATA:  Worsening cough and shortness of breath. EXAM: CHEST - 2 VIEW COMPARISON:  04/28/2021. FINDINGS: Trachea is midline. Heart size stable. Enlarging moderate left pleural effusion with left basilar atelectasis. Right lung is clear. Lungs are hyperinflated. Biapical pleuroparenchymal scarring. IMPRESSION: Enlarging moderate left pleural effusion with adjacent atelectasis in the left lower lobe. Difficult to exclude an underlying mass. Recommend follow-up to clearing. Electronically Signed   By: Lorin Picket M.D.   On: 07/06/2021 15:03    ? ?Procedures ?Procedures (including critical care time) ? ?Medications Ordered in UC ?Medications - No data to display ? ?Initial Impression / Assessment and Plan / UC Course  ?I have reviewed the triage vital signs

## 2021-07-07 DIAGNOSIS — J90 Pleural effusion, not elsewhere classified: Secondary | ICD-10-CM | POA: Diagnosis not present

## 2021-07-27 DIAGNOSIS — R0609 Other forms of dyspnea: Secondary | ICD-10-CM | POA: Diagnosis not present

## 2021-07-27 DIAGNOSIS — F410 Panic disorder [episodic paroxysmal anxiety] without agoraphobia: Secondary | ICD-10-CM | POA: Diagnosis not present

## 2021-08-07 ENCOUNTER — Other Ambulatory Visit (HOSPITAL_COMMUNITY): Payer: Self-pay | Admitting: Family Medicine

## 2021-08-07 DIAGNOSIS — J90 Pleural effusion, not elsewhere classified: Secondary | ICD-10-CM

## 2021-08-19 ENCOUNTER — Ambulatory Visit (HOSPITAL_COMMUNITY)
Admission: RE | Admit: 2021-08-19 | Discharge: 2021-08-19 | Disposition: A | Discharge status: Home / Self Care | Payer: Medicare Other | Source: Ambulatory Visit | Attending: Family Medicine | Admitting: Family Medicine

## 2021-08-19 DIAGNOSIS — J449 Chronic obstructive pulmonary disease, unspecified: Secondary | ICD-10-CM | POA: Diagnosis not present

## 2021-08-19 DIAGNOSIS — G629 Polyneuropathy, unspecified: Secondary | ICD-10-CM | POA: Diagnosis not present

## 2021-08-19 DIAGNOSIS — J90 Pleural effusion, not elsewhere classified: Secondary | ICD-10-CM | POA: Insufficient documentation

## 2021-08-19 DIAGNOSIS — F410 Panic disorder [episodic paroxysmal anxiety] without agoraphobia: Secondary | ICD-10-CM | POA: Diagnosis not present

## 2021-08-19 DIAGNOSIS — R0609 Other forms of dyspnea: Secondary | ICD-10-CM | POA: Diagnosis not present

## 2021-08-26 ENCOUNTER — Other Ambulatory Visit (HOSPITAL_COMMUNITY): Payer: Self-pay | Admitting: Family Medicine

## 2021-08-26 ENCOUNTER — Other Ambulatory Visit: Payer: Self-pay | Admitting: Family Medicine

## 2021-08-26 DIAGNOSIS — J90 Pleural effusion, not elsewhere classified: Secondary | ICD-10-CM

## 2021-09-12 DIAGNOSIS — R5381 Other malaise: Secondary | ICD-10-CM | POA: Diagnosis not present

## 2021-09-12 DIAGNOSIS — R69 Illness, unspecified: Secondary | ICD-10-CM | POA: Diagnosis not present

## 2021-09-16 DIAGNOSIS — J449 Chronic obstructive pulmonary disease, unspecified: Secondary | ICD-10-CM | POA: Diagnosis not present

## 2021-09-16 DIAGNOSIS — J90 Pleural effusion, not elsewhere classified: Secondary | ICD-10-CM | POA: Diagnosis not present

## 2021-09-16 DIAGNOSIS — J9601 Acute respiratory failure with hypoxia: Secondary | ICD-10-CM | POA: Diagnosis not present

## 2021-09-16 DIAGNOSIS — R000 Tachycardia, unspecified: Secondary | ICD-10-CM | POA: Diagnosis not present

## 2021-09-17 DIAGNOSIS — E440 Moderate protein-calorie malnutrition: Secondary | ICD-10-CM | POA: Diagnosis not present

## 2021-09-17 DIAGNOSIS — J449 Chronic obstructive pulmonary disease, unspecified: Secondary | ICD-10-CM | POA: Diagnosis not present

## 2021-09-17 DIAGNOSIS — R531 Weakness: Secondary | ICD-10-CM | POA: Diagnosis not present

## 2021-09-17 DIAGNOSIS — R42 Dizziness and giddiness: Secondary | ICD-10-CM | POA: Diagnosis not present

## 2021-09-17 DIAGNOSIS — R000 Tachycardia, unspecified: Secondary | ICD-10-CM | POA: Diagnosis not present

## 2021-09-17 DIAGNOSIS — Z8616 Personal history of COVID-19: Secondary | ICD-10-CM | POA: Diagnosis not present

## 2021-09-17 DIAGNOSIS — E039 Hypothyroidism, unspecified: Secondary | ICD-10-CM | POA: Diagnosis not present

## 2021-09-17 DIAGNOSIS — F410 Panic disorder [episodic paroxysmal anxiety] without agoraphobia: Secondary | ICD-10-CM | POA: Diagnosis not present

## 2021-09-17 DIAGNOSIS — J9601 Acute respiratory failure with hypoxia: Secondary | ICD-10-CM | POA: Diagnosis not present

## 2021-09-17 DIAGNOSIS — E538 Deficiency of other specified B group vitamins: Secondary | ICD-10-CM | POA: Diagnosis not present

## 2021-09-17 DIAGNOSIS — E559 Vitamin D deficiency, unspecified: Secondary | ICD-10-CM | POA: Diagnosis not present

## 2021-09-17 DIAGNOSIS — J90 Pleural effusion, not elsewhere classified: Secondary | ICD-10-CM | POA: Diagnosis not present

## 2021-09-21 DIAGNOSIS — J449 Chronic obstructive pulmonary disease, unspecified: Secondary | ICD-10-CM | POA: Diagnosis not present

## 2021-09-21 DIAGNOSIS — E039 Hypothyroidism, unspecified: Secondary | ICD-10-CM | POA: Diagnosis not present

## 2021-09-21 DIAGNOSIS — E538 Deficiency of other specified B group vitamins: Secondary | ICD-10-CM | POA: Diagnosis not present

## 2021-09-21 DIAGNOSIS — E440 Moderate protein-calorie malnutrition: Secondary | ICD-10-CM | POA: Diagnosis not present

## 2021-09-21 DIAGNOSIS — J9601 Acute respiratory failure with hypoxia: Secondary | ICD-10-CM | POA: Diagnosis not present

## 2021-09-21 DIAGNOSIS — E559 Vitamin D deficiency, unspecified: Secondary | ICD-10-CM | POA: Diagnosis not present

## 2021-09-22 DIAGNOSIS — R531 Weakness: Secondary | ICD-10-CM | POA: Diagnosis not present

## 2021-09-22 DIAGNOSIS — E538 Deficiency of other specified B group vitamins: Secondary | ICD-10-CM | POA: Diagnosis not present

## 2021-09-22 DIAGNOSIS — R000 Tachycardia, unspecified: Secondary | ICD-10-CM | POA: Diagnosis not present

## 2021-09-22 DIAGNOSIS — F410 Panic disorder [episodic paroxysmal anxiety] without agoraphobia: Secondary | ICD-10-CM | POA: Diagnosis not present

## 2021-09-22 DIAGNOSIS — E440 Moderate protein-calorie malnutrition: Secondary | ICD-10-CM | POA: Diagnosis not present

## 2021-09-22 DIAGNOSIS — R42 Dizziness and giddiness: Secondary | ICD-10-CM | POA: Diagnosis not present

## 2021-09-22 DIAGNOSIS — J90 Pleural effusion, not elsewhere classified: Secondary | ICD-10-CM | POA: Diagnosis not present

## 2021-09-22 DIAGNOSIS — E559 Vitamin D deficiency, unspecified: Secondary | ICD-10-CM | POA: Diagnosis not present

## 2021-09-22 DIAGNOSIS — E039 Hypothyroidism, unspecified: Secondary | ICD-10-CM | POA: Diagnosis not present

## 2021-09-22 DIAGNOSIS — Z8616 Personal history of COVID-19: Secondary | ICD-10-CM | POA: Diagnosis not present

## 2021-09-22 DIAGNOSIS — J9601 Acute respiratory failure with hypoxia: Secondary | ICD-10-CM | POA: Diagnosis not present

## 2021-09-22 DIAGNOSIS — J449 Chronic obstructive pulmonary disease, unspecified: Secondary | ICD-10-CM | POA: Diagnosis not present

## 2021-09-28 DIAGNOSIS — E538 Deficiency of other specified B group vitamins: Secondary | ICD-10-CM | POA: Diagnosis not present

## 2021-09-28 DIAGNOSIS — E440 Moderate protein-calorie malnutrition: Secondary | ICD-10-CM | POA: Diagnosis not present

## 2021-09-28 DIAGNOSIS — J9601 Acute respiratory failure with hypoxia: Secondary | ICD-10-CM | POA: Diagnosis not present

## 2021-09-28 DIAGNOSIS — J449 Chronic obstructive pulmonary disease, unspecified: Secondary | ICD-10-CM | POA: Diagnosis not present

## 2021-09-28 DIAGNOSIS — E559 Vitamin D deficiency, unspecified: Secondary | ICD-10-CM | POA: Diagnosis not present

## 2021-09-28 DIAGNOSIS — E039 Hypothyroidism, unspecified: Secondary | ICD-10-CM | POA: Diagnosis not present

## 2021-09-29 ENCOUNTER — Encounter (HOSPITAL_COMMUNITY): Payer: Self-pay

## 2021-09-29 ENCOUNTER — Ambulatory Visit (HOSPITAL_COMMUNITY): Payer: Medicare Other

## 2021-10-05 DIAGNOSIS — E538 Deficiency of other specified B group vitamins: Secondary | ICD-10-CM | POA: Diagnosis not present

## 2021-10-05 DIAGNOSIS — J9601 Acute respiratory failure with hypoxia: Secondary | ICD-10-CM | POA: Diagnosis not present

## 2021-10-05 DIAGNOSIS — E440 Moderate protein-calorie malnutrition: Secondary | ICD-10-CM | POA: Diagnosis not present

## 2021-10-05 DIAGNOSIS — E039 Hypothyroidism, unspecified: Secondary | ICD-10-CM | POA: Diagnosis not present

## 2021-10-05 DIAGNOSIS — E559 Vitamin D deficiency, unspecified: Secondary | ICD-10-CM | POA: Diagnosis not present

## 2021-10-05 DIAGNOSIS — J449 Chronic obstructive pulmonary disease, unspecified: Secondary | ICD-10-CM | POA: Diagnosis not present

## 2021-10-12 DIAGNOSIS — E440 Moderate protein-calorie malnutrition: Secondary | ICD-10-CM | POA: Diagnosis not present

## 2021-10-12 DIAGNOSIS — E559 Vitamin D deficiency, unspecified: Secondary | ICD-10-CM | POA: Diagnosis not present

## 2021-10-12 DIAGNOSIS — E039 Hypothyroidism, unspecified: Secondary | ICD-10-CM | POA: Diagnosis not present

## 2021-10-12 DIAGNOSIS — J449 Chronic obstructive pulmonary disease, unspecified: Secondary | ICD-10-CM | POA: Diagnosis not present

## 2021-10-12 DIAGNOSIS — E538 Deficiency of other specified B group vitamins: Secondary | ICD-10-CM | POA: Diagnosis not present

## 2021-10-12 DIAGNOSIS — J9601 Acute respiratory failure with hypoxia: Secondary | ICD-10-CM | POA: Diagnosis not present

## 2021-10-19 DIAGNOSIS — E039 Hypothyroidism, unspecified: Secondary | ICD-10-CM | POA: Diagnosis not present

## 2021-10-19 DIAGNOSIS — E440 Moderate protein-calorie malnutrition: Secondary | ICD-10-CM | POA: Diagnosis not present

## 2021-10-19 DIAGNOSIS — E559 Vitamin D deficiency, unspecified: Secondary | ICD-10-CM | POA: Diagnosis not present

## 2021-10-19 DIAGNOSIS — E538 Deficiency of other specified B group vitamins: Secondary | ICD-10-CM | POA: Diagnosis not present

## 2021-10-19 DIAGNOSIS — J449 Chronic obstructive pulmonary disease, unspecified: Secondary | ICD-10-CM | POA: Diagnosis not present

## 2021-10-19 DIAGNOSIS — J9601 Acute respiratory failure with hypoxia: Secondary | ICD-10-CM | POA: Diagnosis not present

## 2021-10-20 DIAGNOSIS — J449 Chronic obstructive pulmonary disease, unspecified: Secondary | ICD-10-CM | POA: Diagnosis not present

## 2021-10-20 DIAGNOSIS — E039 Hypothyroidism, unspecified: Secondary | ICD-10-CM | POA: Diagnosis not present

## 2021-10-20 DIAGNOSIS — J9601 Acute respiratory failure with hypoxia: Secondary | ICD-10-CM | POA: Diagnosis not present

## 2021-10-20 DIAGNOSIS — E559 Vitamin D deficiency, unspecified: Secondary | ICD-10-CM | POA: Diagnosis not present

## 2021-10-20 DIAGNOSIS — E538 Deficiency of other specified B group vitamins: Secondary | ICD-10-CM | POA: Diagnosis not present

## 2021-10-20 DIAGNOSIS — E440 Moderate protein-calorie malnutrition: Secondary | ICD-10-CM | POA: Diagnosis not present

## 2021-10-23 DIAGNOSIS — E440 Moderate protein-calorie malnutrition: Secondary | ICD-10-CM | POA: Diagnosis not present

## 2021-10-23 DIAGNOSIS — J90 Pleural effusion, not elsewhere classified: Secondary | ICD-10-CM | POA: Diagnosis not present

## 2021-10-23 DIAGNOSIS — E039 Hypothyroidism, unspecified: Secondary | ICD-10-CM | POA: Diagnosis not present

## 2021-10-23 DIAGNOSIS — J9601 Acute respiratory failure with hypoxia: Secondary | ICD-10-CM | POA: Diagnosis not present

## 2021-10-23 DIAGNOSIS — E559 Vitamin D deficiency, unspecified: Secondary | ICD-10-CM | POA: Diagnosis not present

## 2021-10-23 DIAGNOSIS — E538 Deficiency of other specified B group vitamins: Secondary | ICD-10-CM | POA: Diagnosis not present

## 2021-10-23 DIAGNOSIS — R42 Dizziness and giddiness: Secondary | ICD-10-CM | POA: Diagnosis not present

## 2021-10-23 DIAGNOSIS — Z8616 Personal history of COVID-19: Secondary | ICD-10-CM | POA: Diagnosis not present

## 2021-10-23 DIAGNOSIS — R000 Tachycardia, unspecified: Secondary | ICD-10-CM | POA: Diagnosis not present

## 2021-10-23 DIAGNOSIS — R531 Weakness: Secondary | ICD-10-CM | POA: Diagnosis not present

## 2021-10-23 DIAGNOSIS — F410 Panic disorder [episodic paroxysmal anxiety] without agoraphobia: Secondary | ICD-10-CM | POA: Diagnosis not present

## 2021-10-23 DIAGNOSIS — J449 Chronic obstructive pulmonary disease, unspecified: Secondary | ICD-10-CM | POA: Diagnosis not present

## 2021-10-28 DIAGNOSIS — J9601 Acute respiratory failure with hypoxia: Secondary | ICD-10-CM | POA: Diagnosis not present

## 2021-10-28 DIAGNOSIS — E440 Moderate protein-calorie malnutrition: Secondary | ICD-10-CM | POA: Diagnosis not present

## 2021-10-28 DIAGNOSIS — J449 Chronic obstructive pulmonary disease, unspecified: Secondary | ICD-10-CM | POA: Diagnosis not present

## 2021-10-28 DIAGNOSIS — E039 Hypothyroidism, unspecified: Secondary | ICD-10-CM | POA: Diagnosis not present

## 2021-10-28 DIAGNOSIS — E559 Vitamin D deficiency, unspecified: Secondary | ICD-10-CM | POA: Diagnosis not present

## 2021-10-28 DIAGNOSIS — E538 Deficiency of other specified B group vitamins: Secondary | ICD-10-CM | POA: Diagnosis not present

## 2021-11-04 DIAGNOSIS — E559 Vitamin D deficiency, unspecified: Secondary | ICD-10-CM | POA: Diagnosis not present

## 2021-11-04 DIAGNOSIS — E039 Hypothyroidism, unspecified: Secondary | ICD-10-CM | POA: Diagnosis not present

## 2021-11-04 DIAGNOSIS — E538 Deficiency of other specified B group vitamins: Secondary | ICD-10-CM | POA: Diagnosis not present

## 2021-11-04 DIAGNOSIS — J9601 Acute respiratory failure with hypoxia: Secondary | ICD-10-CM | POA: Diagnosis not present

## 2021-11-04 DIAGNOSIS — E440 Moderate protein-calorie malnutrition: Secondary | ICD-10-CM | POA: Diagnosis not present

## 2021-11-04 DIAGNOSIS — J449 Chronic obstructive pulmonary disease, unspecified: Secondary | ICD-10-CM | POA: Diagnosis not present

## 2021-11-11 DIAGNOSIS — E538 Deficiency of other specified B group vitamins: Secondary | ICD-10-CM | POA: Diagnosis not present

## 2021-11-11 DIAGNOSIS — E039 Hypothyroidism, unspecified: Secondary | ICD-10-CM | POA: Diagnosis not present

## 2021-11-11 DIAGNOSIS — E440 Moderate protein-calorie malnutrition: Secondary | ICD-10-CM | POA: Diagnosis not present

## 2021-11-11 DIAGNOSIS — E559 Vitamin D deficiency, unspecified: Secondary | ICD-10-CM | POA: Diagnosis not present

## 2021-11-11 DIAGNOSIS — J9601 Acute respiratory failure with hypoxia: Secondary | ICD-10-CM | POA: Diagnosis not present

## 2021-11-11 DIAGNOSIS — J449 Chronic obstructive pulmonary disease, unspecified: Secondary | ICD-10-CM | POA: Diagnosis not present

## 2021-11-18 DIAGNOSIS — J449 Chronic obstructive pulmonary disease, unspecified: Secondary | ICD-10-CM | POA: Diagnosis not present

## 2021-11-18 DIAGNOSIS — E039 Hypothyroidism, unspecified: Secondary | ICD-10-CM | POA: Diagnosis not present

## 2021-11-18 DIAGNOSIS — J9601 Acute respiratory failure with hypoxia: Secondary | ICD-10-CM | POA: Diagnosis not present

## 2021-11-18 DIAGNOSIS — E538 Deficiency of other specified B group vitamins: Secondary | ICD-10-CM | POA: Diagnosis not present

## 2021-11-18 DIAGNOSIS — E440 Moderate protein-calorie malnutrition: Secondary | ICD-10-CM | POA: Diagnosis not present

## 2021-11-18 DIAGNOSIS — E559 Vitamin D deficiency, unspecified: Secondary | ICD-10-CM | POA: Diagnosis not present

## 2021-11-20 DIAGNOSIS — J449 Chronic obstructive pulmonary disease, unspecified: Secondary | ICD-10-CM | POA: Diagnosis not present

## 2021-11-20 DIAGNOSIS — E039 Hypothyroidism, unspecified: Secondary | ICD-10-CM | POA: Diagnosis not present

## 2021-11-20 DIAGNOSIS — E440 Moderate protein-calorie malnutrition: Secondary | ICD-10-CM | POA: Diagnosis not present

## 2021-11-20 DIAGNOSIS — E559 Vitamin D deficiency, unspecified: Secondary | ICD-10-CM | POA: Diagnosis not present

## 2021-11-20 DIAGNOSIS — J9601 Acute respiratory failure with hypoxia: Secondary | ICD-10-CM | POA: Diagnosis not present

## 2021-11-20 DIAGNOSIS — E538 Deficiency of other specified B group vitamins: Secondary | ICD-10-CM | POA: Diagnosis not present

## 2021-11-22 DIAGNOSIS — Z8616 Personal history of COVID-19: Secondary | ICD-10-CM | POA: Diagnosis not present

## 2021-11-22 DIAGNOSIS — R531 Weakness: Secondary | ICD-10-CM | POA: Diagnosis not present

## 2021-11-22 DIAGNOSIS — J9601 Acute respiratory failure with hypoxia: Secondary | ICD-10-CM | POA: Diagnosis not present

## 2021-11-22 DIAGNOSIS — E440 Moderate protein-calorie malnutrition: Secondary | ICD-10-CM | POA: Diagnosis not present

## 2021-11-22 DIAGNOSIS — R42 Dizziness and giddiness: Secondary | ICD-10-CM | POA: Diagnosis not present

## 2021-11-22 DIAGNOSIS — R000 Tachycardia, unspecified: Secondary | ICD-10-CM | POA: Diagnosis not present

## 2021-11-22 DIAGNOSIS — J449 Chronic obstructive pulmonary disease, unspecified: Secondary | ICD-10-CM | POA: Diagnosis not present

## 2021-11-22 DIAGNOSIS — E039 Hypothyroidism, unspecified: Secondary | ICD-10-CM | POA: Diagnosis not present

## 2021-11-22 DIAGNOSIS — F410 Panic disorder [episodic paroxysmal anxiety] without agoraphobia: Secondary | ICD-10-CM | POA: Diagnosis not present

## 2021-11-22 DIAGNOSIS — E559 Vitamin D deficiency, unspecified: Secondary | ICD-10-CM | POA: Diagnosis not present

## 2021-11-22 DIAGNOSIS — E538 Deficiency of other specified B group vitamins: Secondary | ICD-10-CM | POA: Diagnosis not present

## 2021-11-22 DIAGNOSIS — J90 Pleural effusion, not elsewhere classified: Secondary | ICD-10-CM | POA: Diagnosis not present

## 2021-11-25 DIAGNOSIS — E538 Deficiency of other specified B group vitamins: Secondary | ICD-10-CM | POA: Diagnosis not present

## 2021-11-25 DIAGNOSIS — J9601 Acute respiratory failure with hypoxia: Secondary | ICD-10-CM | POA: Diagnosis not present

## 2021-11-25 DIAGNOSIS — E440 Moderate protein-calorie malnutrition: Secondary | ICD-10-CM | POA: Diagnosis not present

## 2021-11-25 DIAGNOSIS — J449 Chronic obstructive pulmonary disease, unspecified: Secondary | ICD-10-CM | POA: Diagnosis not present

## 2021-11-25 DIAGNOSIS — E559 Vitamin D deficiency, unspecified: Secondary | ICD-10-CM | POA: Diagnosis not present

## 2021-11-25 DIAGNOSIS — E039 Hypothyroidism, unspecified: Secondary | ICD-10-CM | POA: Diagnosis not present

## 2021-12-02 DIAGNOSIS — E538 Deficiency of other specified B group vitamins: Secondary | ICD-10-CM | POA: Diagnosis not present

## 2021-12-02 DIAGNOSIS — E559 Vitamin D deficiency, unspecified: Secondary | ICD-10-CM | POA: Diagnosis not present

## 2021-12-02 DIAGNOSIS — J9601 Acute respiratory failure with hypoxia: Secondary | ICD-10-CM | POA: Diagnosis not present

## 2021-12-02 DIAGNOSIS — E039 Hypothyroidism, unspecified: Secondary | ICD-10-CM | POA: Diagnosis not present

## 2021-12-02 DIAGNOSIS — J449 Chronic obstructive pulmonary disease, unspecified: Secondary | ICD-10-CM | POA: Diagnosis not present

## 2021-12-02 DIAGNOSIS — E440 Moderate protein-calorie malnutrition: Secondary | ICD-10-CM | POA: Diagnosis not present

## 2021-12-09 DIAGNOSIS — E559 Vitamin D deficiency, unspecified: Secondary | ICD-10-CM | POA: Diagnosis not present

## 2021-12-09 DIAGNOSIS — E440 Moderate protein-calorie malnutrition: Secondary | ICD-10-CM | POA: Diagnosis not present

## 2021-12-09 DIAGNOSIS — E039 Hypothyroidism, unspecified: Secondary | ICD-10-CM | POA: Diagnosis not present

## 2021-12-09 DIAGNOSIS — J9601 Acute respiratory failure with hypoxia: Secondary | ICD-10-CM | POA: Diagnosis not present

## 2021-12-09 DIAGNOSIS — E538 Deficiency of other specified B group vitamins: Secondary | ICD-10-CM | POA: Diagnosis not present

## 2021-12-09 DIAGNOSIS — J449 Chronic obstructive pulmonary disease, unspecified: Secondary | ICD-10-CM | POA: Diagnosis not present

## 2021-12-15 DIAGNOSIS — E440 Moderate protein-calorie malnutrition: Secondary | ICD-10-CM | POA: Diagnosis not present

## 2021-12-15 DIAGNOSIS — E559 Vitamin D deficiency, unspecified: Secondary | ICD-10-CM | POA: Diagnosis not present

## 2021-12-15 DIAGNOSIS — J9601 Acute respiratory failure with hypoxia: Secondary | ICD-10-CM | POA: Diagnosis not present

## 2021-12-15 DIAGNOSIS — J449 Chronic obstructive pulmonary disease, unspecified: Secondary | ICD-10-CM | POA: Diagnosis not present

## 2021-12-15 DIAGNOSIS — E538 Deficiency of other specified B group vitamins: Secondary | ICD-10-CM | POA: Diagnosis not present

## 2021-12-15 DIAGNOSIS — E039 Hypothyroidism, unspecified: Secondary | ICD-10-CM | POA: Diagnosis not present

## 2021-12-16 DIAGNOSIS — E039 Hypothyroidism, unspecified: Secondary | ICD-10-CM | POA: Diagnosis not present

## 2021-12-16 DIAGNOSIS — J449 Chronic obstructive pulmonary disease, unspecified: Secondary | ICD-10-CM | POA: Diagnosis not present

## 2021-12-16 DIAGNOSIS — E440 Moderate protein-calorie malnutrition: Secondary | ICD-10-CM | POA: Diagnosis not present

## 2021-12-16 DIAGNOSIS — E538 Deficiency of other specified B group vitamins: Secondary | ICD-10-CM | POA: Diagnosis not present

## 2021-12-16 DIAGNOSIS — J9601 Acute respiratory failure with hypoxia: Secondary | ICD-10-CM | POA: Diagnosis not present

## 2021-12-16 DIAGNOSIS — E559 Vitamin D deficiency, unspecified: Secondary | ICD-10-CM | POA: Diagnosis not present

## 2021-12-23 DIAGNOSIS — Z8616 Personal history of COVID-19: Secondary | ICD-10-CM | POA: Diagnosis not present

## 2021-12-23 DIAGNOSIS — R531 Weakness: Secondary | ICD-10-CM | POA: Diagnosis not present

## 2021-12-23 DIAGNOSIS — J9601 Acute respiratory failure with hypoxia: Secondary | ICD-10-CM | POA: Diagnosis not present

## 2021-12-23 DIAGNOSIS — E559 Vitamin D deficiency, unspecified: Secondary | ICD-10-CM | POA: Diagnosis not present

## 2021-12-23 DIAGNOSIS — R42 Dizziness and giddiness: Secondary | ICD-10-CM | POA: Diagnosis not present

## 2021-12-23 DIAGNOSIS — F410 Panic disorder [episodic paroxysmal anxiety] without agoraphobia: Secondary | ICD-10-CM | POA: Diagnosis not present

## 2021-12-23 DIAGNOSIS — J449 Chronic obstructive pulmonary disease, unspecified: Secondary | ICD-10-CM | POA: Diagnosis not present

## 2021-12-23 DIAGNOSIS — E538 Deficiency of other specified B group vitamins: Secondary | ICD-10-CM | POA: Diagnosis not present

## 2021-12-23 DIAGNOSIS — E039 Hypothyroidism, unspecified: Secondary | ICD-10-CM | POA: Diagnosis not present

## 2021-12-23 DIAGNOSIS — R000 Tachycardia, unspecified: Secondary | ICD-10-CM | POA: Diagnosis not present

## 2021-12-23 DIAGNOSIS — E440 Moderate protein-calorie malnutrition: Secondary | ICD-10-CM | POA: Diagnosis not present

## 2021-12-23 DIAGNOSIS — J90 Pleural effusion, not elsewhere classified: Secondary | ICD-10-CM | POA: Diagnosis not present

## 2021-12-30 DIAGNOSIS — J9601 Acute respiratory failure with hypoxia: Secondary | ICD-10-CM | POA: Diagnosis not present

## 2021-12-30 DIAGNOSIS — J449 Chronic obstructive pulmonary disease, unspecified: Secondary | ICD-10-CM | POA: Diagnosis not present

## 2021-12-30 DIAGNOSIS — E440 Moderate protein-calorie malnutrition: Secondary | ICD-10-CM | POA: Diagnosis not present

## 2021-12-30 DIAGNOSIS — E039 Hypothyroidism, unspecified: Secondary | ICD-10-CM | POA: Diagnosis not present

## 2021-12-30 DIAGNOSIS — E538 Deficiency of other specified B group vitamins: Secondary | ICD-10-CM | POA: Diagnosis not present

## 2021-12-30 DIAGNOSIS — E559 Vitamin D deficiency, unspecified: Secondary | ICD-10-CM | POA: Diagnosis not present

## 2022-01-04 DIAGNOSIS — J449 Chronic obstructive pulmonary disease, unspecified: Secondary | ICD-10-CM | POA: Diagnosis not present

## 2022-01-04 DIAGNOSIS — J9601 Acute respiratory failure with hypoxia: Secondary | ICD-10-CM | POA: Diagnosis not present

## 2022-01-04 DIAGNOSIS — E559 Vitamin D deficiency, unspecified: Secondary | ICD-10-CM | POA: Diagnosis not present

## 2022-01-04 DIAGNOSIS — E440 Moderate protein-calorie malnutrition: Secondary | ICD-10-CM | POA: Diagnosis not present

## 2022-01-04 DIAGNOSIS — E538 Deficiency of other specified B group vitamins: Secondary | ICD-10-CM | POA: Diagnosis not present

## 2022-01-04 DIAGNOSIS — E039 Hypothyroidism, unspecified: Secondary | ICD-10-CM | POA: Diagnosis not present

## 2022-01-06 DIAGNOSIS — E559 Vitamin D deficiency, unspecified: Secondary | ICD-10-CM | POA: Diagnosis not present

## 2022-01-06 DIAGNOSIS — J449 Chronic obstructive pulmonary disease, unspecified: Secondary | ICD-10-CM | POA: Diagnosis not present

## 2022-01-06 DIAGNOSIS — E440 Moderate protein-calorie malnutrition: Secondary | ICD-10-CM | POA: Diagnosis not present

## 2022-01-06 DIAGNOSIS — J9601 Acute respiratory failure with hypoxia: Secondary | ICD-10-CM | POA: Diagnosis not present

## 2022-01-06 DIAGNOSIS — E039 Hypothyroidism, unspecified: Secondary | ICD-10-CM | POA: Diagnosis not present

## 2022-01-06 DIAGNOSIS — E538 Deficiency of other specified B group vitamins: Secondary | ICD-10-CM | POA: Diagnosis not present

## 2022-01-13 DIAGNOSIS — J449 Chronic obstructive pulmonary disease, unspecified: Secondary | ICD-10-CM | POA: Diagnosis not present

## 2022-01-13 DIAGNOSIS — J9601 Acute respiratory failure with hypoxia: Secondary | ICD-10-CM | POA: Diagnosis not present

## 2022-01-13 DIAGNOSIS — E440 Moderate protein-calorie malnutrition: Secondary | ICD-10-CM | POA: Diagnosis not present

## 2022-01-13 DIAGNOSIS — E039 Hypothyroidism, unspecified: Secondary | ICD-10-CM | POA: Diagnosis not present

## 2022-01-13 DIAGNOSIS — E538 Deficiency of other specified B group vitamins: Secondary | ICD-10-CM | POA: Diagnosis not present

## 2022-01-13 DIAGNOSIS — E559 Vitamin D deficiency, unspecified: Secondary | ICD-10-CM | POA: Diagnosis not present

## 2022-01-19 DIAGNOSIS — I4430 Unspecified atrioventricular block: Secondary | ICD-10-CM | POA: Diagnosis not present

## 2022-01-19 DIAGNOSIS — R55 Syncope and collapse: Secondary | ICD-10-CM | POA: Diagnosis not present

## 2022-01-19 DIAGNOSIS — J449 Chronic obstructive pulmonary disease, unspecified: Secondary | ICD-10-CM | POA: Diagnosis not present

## 2022-01-19 DIAGNOSIS — R0689 Other abnormalities of breathing: Secondary | ICD-10-CM | POA: Diagnosis not present

## 2022-01-19 DIAGNOSIS — E440 Moderate protein-calorie malnutrition: Secondary | ICD-10-CM | POA: Diagnosis not present

## 2022-01-19 DIAGNOSIS — I441 Atrioventricular block, second degree: Secondary | ICD-10-CM | POA: Diagnosis not present

## 2022-01-19 DIAGNOSIS — E039 Hypothyroidism, unspecified: Secondary | ICD-10-CM | POA: Diagnosis not present

## 2022-01-19 DIAGNOSIS — R000 Tachycardia, unspecified: Secondary | ICD-10-CM | POA: Diagnosis not present

## 2022-01-19 DIAGNOSIS — J9601 Acute respiratory failure with hypoxia: Secondary | ICD-10-CM | POA: Diagnosis not present

## 2022-01-19 DIAGNOSIS — E538 Deficiency of other specified B group vitamins: Secondary | ICD-10-CM | POA: Diagnosis not present

## 2022-01-19 DIAGNOSIS — E559 Vitamin D deficiency, unspecified: Secondary | ICD-10-CM | POA: Diagnosis not present

## 2022-01-20 DIAGNOSIS — J9601 Acute respiratory failure with hypoxia: Secondary | ICD-10-CM | POA: Diagnosis not present

## 2022-01-20 DIAGNOSIS — J449 Chronic obstructive pulmonary disease, unspecified: Secondary | ICD-10-CM | POA: Diagnosis not present

## 2022-01-20 DIAGNOSIS — E538 Deficiency of other specified B group vitamins: Secondary | ICD-10-CM | POA: Diagnosis not present

## 2022-01-20 DIAGNOSIS — E440 Moderate protein-calorie malnutrition: Secondary | ICD-10-CM | POA: Diagnosis not present

## 2022-01-20 DIAGNOSIS — E559 Vitamin D deficiency, unspecified: Secondary | ICD-10-CM | POA: Diagnosis not present

## 2022-01-20 DIAGNOSIS — E039 Hypothyroidism, unspecified: Secondary | ICD-10-CM | POA: Diagnosis not present

## 2022-01-22 DIAGNOSIS — E538 Deficiency of other specified B group vitamins: Secondary | ICD-10-CM | POA: Diagnosis not present

## 2022-01-22 DIAGNOSIS — E559 Vitamin D deficiency, unspecified: Secondary | ICD-10-CM | POA: Diagnosis not present

## 2022-01-22 DIAGNOSIS — E440 Moderate protein-calorie malnutrition: Secondary | ICD-10-CM | POA: Diagnosis not present

## 2022-01-22 DIAGNOSIS — R531 Weakness: Secondary | ICD-10-CM | POA: Diagnosis not present

## 2022-01-22 DIAGNOSIS — J9601 Acute respiratory failure with hypoxia: Secondary | ICD-10-CM | POA: Diagnosis not present

## 2022-01-22 DIAGNOSIS — R42 Dizziness and giddiness: Secondary | ICD-10-CM | POA: Diagnosis not present

## 2022-01-22 DIAGNOSIS — J90 Pleural effusion, not elsewhere classified: Secondary | ICD-10-CM | POA: Diagnosis not present

## 2022-01-22 DIAGNOSIS — F410 Panic disorder [episodic paroxysmal anxiety] without agoraphobia: Secondary | ICD-10-CM | POA: Diagnosis not present

## 2022-01-22 DIAGNOSIS — R000 Tachycardia, unspecified: Secondary | ICD-10-CM | POA: Diagnosis not present

## 2022-01-22 DIAGNOSIS — J449 Chronic obstructive pulmonary disease, unspecified: Secondary | ICD-10-CM | POA: Diagnosis not present

## 2022-01-22 DIAGNOSIS — Z8616 Personal history of COVID-19: Secondary | ICD-10-CM | POA: Diagnosis not present

## 2022-01-22 DIAGNOSIS — E039 Hypothyroidism, unspecified: Secondary | ICD-10-CM | POA: Diagnosis not present

## 2022-01-25 DIAGNOSIS — E039 Hypothyroidism, unspecified: Secondary | ICD-10-CM | POA: Diagnosis not present

## 2022-01-25 DIAGNOSIS — J9601 Acute respiratory failure with hypoxia: Secondary | ICD-10-CM | POA: Diagnosis not present

## 2022-01-25 DIAGNOSIS — E440 Moderate protein-calorie malnutrition: Secondary | ICD-10-CM | POA: Diagnosis not present

## 2022-01-25 DIAGNOSIS — E538 Deficiency of other specified B group vitamins: Secondary | ICD-10-CM | POA: Diagnosis not present

## 2022-01-25 DIAGNOSIS — E559 Vitamin D deficiency, unspecified: Secondary | ICD-10-CM | POA: Diagnosis not present

## 2022-01-25 DIAGNOSIS — J449 Chronic obstructive pulmonary disease, unspecified: Secondary | ICD-10-CM | POA: Diagnosis not present

## 2022-01-27 DIAGNOSIS — E559 Vitamin D deficiency, unspecified: Secondary | ICD-10-CM | POA: Diagnosis not present

## 2022-01-27 DIAGNOSIS — E538 Deficiency of other specified B group vitamins: Secondary | ICD-10-CM | POA: Diagnosis not present

## 2022-01-27 DIAGNOSIS — E039 Hypothyroidism, unspecified: Secondary | ICD-10-CM | POA: Diagnosis not present

## 2022-01-27 DIAGNOSIS — E440 Moderate protein-calorie malnutrition: Secondary | ICD-10-CM | POA: Diagnosis not present

## 2022-01-27 DIAGNOSIS — J9601 Acute respiratory failure with hypoxia: Secondary | ICD-10-CM | POA: Diagnosis not present

## 2022-01-27 DIAGNOSIS — J449 Chronic obstructive pulmonary disease, unspecified: Secondary | ICD-10-CM | POA: Diagnosis not present

## 2022-01-28 DIAGNOSIS — E559 Vitamin D deficiency, unspecified: Secondary | ICD-10-CM | POA: Diagnosis not present

## 2022-01-28 DIAGNOSIS — J9601 Acute respiratory failure with hypoxia: Secondary | ICD-10-CM | POA: Diagnosis not present

## 2022-01-28 DIAGNOSIS — E538 Deficiency of other specified B group vitamins: Secondary | ICD-10-CM | POA: Diagnosis not present

## 2022-01-28 DIAGNOSIS — E039 Hypothyroidism, unspecified: Secondary | ICD-10-CM | POA: Diagnosis not present

## 2022-01-28 DIAGNOSIS — E440 Moderate protein-calorie malnutrition: Secondary | ICD-10-CM | POA: Diagnosis not present

## 2022-01-28 DIAGNOSIS — J449 Chronic obstructive pulmonary disease, unspecified: Secondary | ICD-10-CM | POA: Diagnosis not present

## 2022-02-01 DIAGNOSIS — E039 Hypothyroidism, unspecified: Secondary | ICD-10-CM | POA: Diagnosis not present

## 2022-02-01 DIAGNOSIS — E559 Vitamin D deficiency, unspecified: Secondary | ICD-10-CM | POA: Diagnosis not present

## 2022-02-01 DIAGNOSIS — J9601 Acute respiratory failure with hypoxia: Secondary | ICD-10-CM | POA: Diagnosis not present

## 2022-02-01 DIAGNOSIS — E440 Moderate protein-calorie malnutrition: Secondary | ICD-10-CM | POA: Diagnosis not present

## 2022-02-01 DIAGNOSIS — J449 Chronic obstructive pulmonary disease, unspecified: Secondary | ICD-10-CM | POA: Diagnosis not present

## 2022-02-01 DIAGNOSIS — E538 Deficiency of other specified B group vitamins: Secondary | ICD-10-CM | POA: Diagnosis not present

## 2022-02-03 DIAGNOSIS — E440 Moderate protein-calorie malnutrition: Secondary | ICD-10-CM | POA: Diagnosis not present

## 2022-02-03 DIAGNOSIS — J9601 Acute respiratory failure with hypoxia: Secondary | ICD-10-CM | POA: Diagnosis not present

## 2022-02-03 DIAGNOSIS — E538 Deficiency of other specified B group vitamins: Secondary | ICD-10-CM | POA: Diagnosis not present

## 2022-02-03 DIAGNOSIS — E039 Hypothyroidism, unspecified: Secondary | ICD-10-CM | POA: Diagnosis not present

## 2022-02-03 DIAGNOSIS — E559 Vitamin D deficiency, unspecified: Secondary | ICD-10-CM | POA: Diagnosis not present

## 2022-02-03 DIAGNOSIS — J449 Chronic obstructive pulmonary disease, unspecified: Secondary | ICD-10-CM | POA: Diagnosis not present

## 2022-02-04 DIAGNOSIS — E538 Deficiency of other specified B group vitamins: Secondary | ICD-10-CM | POA: Diagnosis not present

## 2022-02-04 DIAGNOSIS — J449 Chronic obstructive pulmonary disease, unspecified: Secondary | ICD-10-CM | POA: Diagnosis not present

## 2022-02-04 DIAGNOSIS — J9601 Acute respiratory failure with hypoxia: Secondary | ICD-10-CM | POA: Diagnosis not present

## 2022-02-04 DIAGNOSIS — E559 Vitamin D deficiency, unspecified: Secondary | ICD-10-CM | POA: Diagnosis not present

## 2022-02-04 DIAGNOSIS — E039 Hypothyroidism, unspecified: Secondary | ICD-10-CM | POA: Diagnosis not present

## 2022-02-04 DIAGNOSIS — E440 Moderate protein-calorie malnutrition: Secondary | ICD-10-CM | POA: Diagnosis not present

## 2022-02-10 DIAGNOSIS — J449 Chronic obstructive pulmonary disease, unspecified: Secondary | ICD-10-CM | POA: Diagnosis not present

## 2022-02-10 DIAGNOSIS — E538 Deficiency of other specified B group vitamins: Secondary | ICD-10-CM | POA: Diagnosis not present

## 2022-02-10 DIAGNOSIS — E440 Moderate protein-calorie malnutrition: Secondary | ICD-10-CM | POA: Diagnosis not present

## 2022-02-10 DIAGNOSIS — E039 Hypothyroidism, unspecified: Secondary | ICD-10-CM | POA: Diagnosis not present

## 2022-02-10 DIAGNOSIS — E559 Vitamin D deficiency, unspecified: Secondary | ICD-10-CM | POA: Diagnosis not present

## 2022-02-10 DIAGNOSIS — J9601 Acute respiratory failure with hypoxia: Secondary | ICD-10-CM | POA: Diagnosis not present

## 2022-02-18 DIAGNOSIS — E039 Hypothyroidism, unspecified: Secondary | ICD-10-CM | POA: Diagnosis not present

## 2022-02-18 DIAGNOSIS — E559 Vitamin D deficiency, unspecified: Secondary | ICD-10-CM | POA: Diagnosis not present

## 2022-02-18 DIAGNOSIS — E538 Deficiency of other specified B group vitamins: Secondary | ICD-10-CM | POA: Diagnosis not present

## 2022-02-18 DIAGNOSIS — J449 Chronic obstructive pulmonary disease, unspecified: Secondary | ICD-10-CM | POA: Diagnosis not present

## 2022-02-18 DIAGNOSIS — E440 Moderate protein-calorie malnutrition: Secondary | ICD-10-CM | POA: Diagnosis not present

## 2022-02-18 DIAGNOSIS — J9601 Acute respiratory failure with hypoxia: Secondary | ICD-10-CM | POA: Diagnosis not present

## 2022-02-22 DIAGNOSIS — F410 Panic disorder [episodic paroxysmal anxiety] without agoraphobia: Secondary | ICD-10-CM | POA: Diagnosis not present

## 2022-02-22 DIAGNOSIS — Z8616 Personal history of COVID-19: Secondary | ICD-10-CM | POA: Diagnosis not present

## 2022-02-22 DIAGNOSIS — E039 Hypothyroidism, unspecified: Secondary | ICD-10-CM | POA: Diagnosis not present

## 2022-02-22 DIAGNOSIS — R531 Weakness: Secondary | ICD-10-CM | POA: Diagnosis not present

## 2022-02-22 DIAGNOSIS — R000 Tachycardia, unspecified: Secondary | ICD-10-CM | POA: Diagnosis not present

## 2022-02-22 DIAGNOSIS — J449 Chronic obstructive pulmonary disease, unspecified: Secondary | ICD-10-CM | POA: Diagnosis not present

## 2022-02-22 DIAGNOSIS — J9601 Acute respiratory failure with hypoxia: Secondary | ICD-10-CM | POA: Diagnosis not present

## 2022-02-22 DIAGNOSIS — J90 Pleural effusion, not elsewhere classified: Secondary | ICD-10-CM | POA: Diagnosis not present

## 2022-02-22 DIAGNOSIS — E559 Vitamin D deficiency, unspecified: Secondary | ICD-10-CM | POA: Diagnosis not present

## 2022-02-22 DIAGNOSIS — E440 Moderate protein-calorie malnutrition: Secondary | ICD-10-CM | POA: Diagnosis not present

## 2022-02-22 DIAGNOSIS — R42 Dizziness and giddiness: Secondary | ICD-10-CM | POA: Diagnosis not present

## 2022-02-22 DIAGNOSIS — E538 Deficiency of other specified B group vitamins: Secondary | ICD-10-CM | POA: Diagnosis not present

## 2022-02-24 DIAGNOSIS — J9601 Acute respiratory failure with hypoxia: Secondary | ICD-10-CM | POA: Diagnosis not present

## 2022-02-24 DIAGNOSIS — E559 Vitamin D deficiency, unspecified: Secondary | ICD-10-CM | POA: Diagnosis not present

## 2022-02-24 DIAGNOSIS — J449 Chronic obstructive pulmonary disease, unspecified: Secondary | ICD-10-CM | POA: Diagnosis not present

## 2022-02-24 DIAGNOSIS — E538 Deficiency of other specified B group vitamins: Secondary | ICD-10-CM | POA: Diagnosis not present

## 2022-02-24 DIAGNOSIS — E039 Hypothyroidism, unspecified: Secondary | ICD-10-CM | POA: Diagnosis not present

## 2022-02-24 DIAGNOSIS — E440 Moderate protein-calorie malnutrition: Secondary | ICD-10-CM | POA: Diagnosis not present

## 2022-03-03 DIAGNOSIS — J449 Chronic obstructive pulmonary disease, unspecified: Secondary | ICD-10-CM | POA: Diagnosis not present

## 2022-03-03 DIAGNOSIS — E440 Moderate protein-calorie malnutrition: Secondary | ICD-10-CM | POA: Diagnosis not present

## 2022-03-03 DIAGNOSIS — J9601 Acute respiratory failure with hypoxia: Secondary | ICD-10-CM | POA: Diagnosis not present

## 2022-03-03 DIAGNOSIS — E559 Vitamin D deficiency, unspecified: Secondary | ICD-10-CM | POA: Diagnosis not present

## 2022-03-03 DIAGNOSIS — E039 Hypothyroidism, unspecified: Secondary | ICD-10-CM | POA: Diagnosis not present

## 2022-03-03 DIAGNOSIS — E538 Deficiency of other specified B group vitamins: Secondary | ICD-10-CM | POA: Diagnosis not present

## 2022-03-10 DIAGNOSIS — J449 Chronic obstructive pulmonary disease, unspecified: Secondary | ICD-10-CM | POA: Diagnosis not present

## 2022-03-10 DIAGNOSIS — E039 Hypothyroidism, unspecified: Secondary | ICD-10-CM | POA: Diagnosis not present

## 2022-03-10 DIAGNOSIS — E440 Moderate protein-calorie malnutrition: Secondary | ICD-10-CM | POA: Diagnosis not present

## 2022-03-10 DIAGNOSIS — E538 Deficiency of other specified B group vitamins: Secondary | ICD-10-CM | POA: Diagnosis not present

## 2022-03-10 DIAGNOSIS — J9601 Acute respiratory failure with hypoxia: Secondary | ICD-10-CM | POA: Diagnosis not present

## 2022-03-10 DIAGNOSIS — E559 Vitamin D deficiency, unspecified: Secondary | ICD-10-CM | POA: Diagnosis not present

## 2022-03-15 DIAGNOSIS — J449 Chronic obstructive pulmonary disease, unspecified: Secondary | ICD-10-CM | POA: Diagnosis not present

## 2022-03-15 DIAGNOSIS — E440 Moderate protein-calorie malnutrition: Secondary | ICD-10-CM | POA: Diagnosis not present

## 2022-03-15 DIAGNOSIS — E039 Hypothyroidism, unspecified: Secondary | ICD-10-CM | POA: Diagnosis not present

## 2022-03-15 DIAGNOSIS — E559 Vitamin D deficiency, unspecified: Secondary | ICD-10-CM | POA: Diagnosis not present

## 2022-03-15 DIAGNOSIS — E538 Deficiency of other specified B group vitamins: Secondary | ICD-10-CM | POA: Diagnosis not present

## 2022-03-15 DIAGNOSIS — J9601 Acute respiratory failure with hypoxia: Secondary | ICD-10-CM | POA: Diagnosis not present

## 2022-03-17 DIAGNOSIS — E538 Deficiency of other specified B group vitamins: Secondary | ICD-10-CM | POA: Diagnosis not present

## 2022-03-17 DIAGNOSIS — E440 Moderate protein-calorie malnutrition: Secondary | ICD-10-CM | POA: Diagnosis not present

## 2022-03-17 DIAGNOSIS — J9601 Acute respiratory failure with hypoxia: Secondary | ICD-10-CM | POA: Diagnosis not present

## 2022-03-17 DIAGNOSIS — E559 Vitamin D deficiency, unspecified: Secondary | ICD-10-CM | POA: Diagnosis not present

## 2022-03-17 DIAGNOSIS — E039 Hypothyroidism, unspecified: Secondary | ICD-10-CM | POA: Diagnosis not present

## 2022-03-17 DIAGNOSIS — J449 Chronic obstructive pulmonary disease, unspecified: Secondary | ICD-10-CM | POA: Diagnosis not present

## 2022-03-24 DIAGNOSIS — E039 Hypothyroidism, unspecified: Secondary | ICD-10-CM | POA: Diagnosis not present

## 2022-03-24 DIAGNOSIS — J9601 Acute respiratory failure with hypoxia: Secondary | ICD-10-CM | POA: Diagnosis not present

## 2022-03-24 DIAGNOSIS — J449 Chronic obstructive pulmonary disease, unspecified: Secondary | ICD-10-CM | POA: Diagnosis not present

## 2022-03-24 DIAGNOSIS — E559 Vitamin D deficiency, unspecified: Secondary | ICD-10-CM | POA: Diagnosis not present

## 2022-03-24 DIAGNOSIS — E440 Moderate protein-calorie malnutrition: Secondary | ICD-10-CM | POA: Diagnosis not present

## 2022-03-24 DIAGNOSIS — E538 Deficiency of other specified B group vitamins: Secondary | ICD-10-CM | POA: Diagnosis not present

## 2022-03-25 DIAGNOSIS — R42 Dizziness and giddiness: Secondary | ICD-10-CM | POA: Diagnosis not present

## 2022-03-25 DIAGNOSIS — E559 Vitamin D deficiency, unspecified: Secondary | ICD-10-CM | POA: Diagnosis not present

## 2022-03-25 DIAGNOSIS — E440 Moderate protein-calorie malnutrition: Secondary | ICD-10-CM | POA: Diagnosis not present

## 2022-03-25 DIAGNOSIS — J90 Pleural effusion, not elsewhere classified: Secondary | ICD-10-CM | POA: Diagnosis not present

## 2022-03-25 DIAGNOSIS — E538 Deficiency of other specified B group vitamins: Secondary | ICD-10-CM | POA: Diagnosis not present

## 2022-03-25 DIAGNOSIS — R000 Tachycardia, unspecified: Secondary | ICD-10-CM | POA: Diagnosis not present

## 2022-03-25 DIAGNOSIS — R531 Weakness: Secondary | ICD-10-CM | POA: Diagnosis not present

## 2022-03-25 DIAGNOSIS — J449 Chronic obstructive pulmonary disease, unspecified: Secondary | ICD-10-CM | POA: Diagnosis not present

## 2022-03-25 DIAGNOSIS — F410 Panic disorder [episodic paroxysmal anxiety] without agoraphobia: Secondary | ICD-10-CM | POA: Diagnosis not present

## 2022-03-25 DIAGNOSIS — J9601 Acute respiratory failure with hypoxia: Secondary | ICD-10-CM | POA: Diagnosis not present

## 2022-03-25 DIAGNOSIS — Z8616 Personal history of COVID-19: Secondary | ICD-10-CM | POA: Diagnosis not present

## 2022-03-25 DIAGNOSIS — E039 Hypothyroidism, unspecified: Secondary | ICD-10-CM | POA: Diagnosis not present

## 2022-03-31 DIAGNOSIS — E538 Deficiency of other specified B group vitamins: Secondary | ICD-10-CM | POA: Diagnosis not present

## 2022-03-31 DIAGNOSIS — E559 Vitamin D deficiency, unspecified: Secondary | ICD-10-CM | POA: Diagnosis not present

## 2022-03-31 DIAGNOSIS — J449 Chronic obstructive pulmonary disease, unspecified: Secondary | ICD-10-CM | POA: Diagnosis not present

## 2022-03-31 DIAGNOSIS — E039 Hypothyroidism, unspecified: Secondary | ICD-10-CM | POA: Diagnosis not present

## 2022-03-31 DIAGNOSIS — E440 Moderate protein-calorie malnutrition: Secondary | ICD-10-CM | POA: Diagnosis not present

## 2022-03-31 DIAGNOSIS — J9601 Acute respiratory failure with hypoxia: Secondary | ICD-10-CM | POA: Diagnosis not present

## 2022-04-04 DIAGNOSIS — E440 Moderate protein-calorie malnutrition: Secondary | ICD-10-CM | POA: Diagnosis not present

## 2022-04-04 DIAGNOSIS — E538 Deficiency of other specified B group vitamins: Secondary | ICD-10-CM | POA: Diagnosis not present

## 2022-04-04 DIAGNOSIS — E039 Hypothyroidism, unspecified: Secondary | ICD-10-CM | POA: Diagnosis not present

## 2022-04-04 DIAGNOSIS — J449 Chronic obstructive pulmonary disease, unspecified: Secondary | ICD-10-CM | POA: Diagnosis not present

## 2022-04-04 DIAGNOSIS — E559 Vitamin D deficiency, unspecified: Secondary | ICD-10-CM | POA: Diagnosis not present

## 2022-04-04 DIAGNOSIS — J9601 Acute respiratory failure with hypoxia: Secondary | ICD-10-CM | POA: Diagnosis not present

## 2022-04-05 DIAGNOSIS — J449 Chronic obstructive pulmonary disease, unspecified: Secondary | ICD-10-CM | POA: Diagnosis not present

## 2022-04-05 DIAGNOSIS — E440 Moderate protein-calorie malnutrition: Secondary | ICD-10-CM | POA: Diagnosis not present

## 2022-04-05 DIAGNOSIS — E039 Hypothyroidism, unspecified: Secondary | ICD-10-CM | POA: Diagnosis not present

## 2022-04-05 DIAGNOSIS — J9601 Acute respiratory failure with hypoxia: Secondary | ICD-10-CM | POA: Diagnosis not present

## 2022-04-05 DIAGNOSIS — E559 Vitamin D deficiency, unspecified: Secondary | ICD-10-CM | POA: Diagnosis not present

## 2022-04-05 DIAGNOSIS — E538 Deficiency of other specified B group vitamins: Secondary | ICD-10-CM | POA: Diagnosis not present

## 2022-04-07 DIAGNOSIS — E039 Hypothyroidism, unspecified: Secondary | ICD-10-CM | POA: Diagnosis not present

## 2022-04-07 DIAGNOSIS — E440 Moderate protein-calorie malnutrition: Secondary | ICD-10-CM | POA: Diagnosis not present

## 2022-04-07 DIAGNOSIS — J449 Chronic obstructive pulmonary disease, unspecified: Secondary | ICD-10-CM | POA: Diagnosis not present

## 2022-04-07 DIAGNOSIS — J9601 Acute respiratory failure with hypoxia: Secondary | ICD-10-CM | POA: Diagnosis not present

## 2022-04-07 DIAGNOSIS — E559 Vitamin D deficiency, unspecified: Secondary | ICD-10-CM | POA: Diagnosis not present

## 2022-04-07 DIAGNOSIS — E538 Deficiency of other specified B group vitamins: Secondary | ICD-10-CM | POA: Diagnosis not present

## 2022-04-14 DIAGNOSIS — E559 Vitamin D deficiency, unspecified: Secondary | ICD-10-CM | POA: Diagnosis not present

## 2022-04-14 DIAGNOSIS — J9601 Acute respiratory failure with hypoxia: Secondary | ICD-10-CM | POA: Diagnosis not present

## 2022-04-14 DIAGNOSIS — E440 Moderate protein-calorie malnutrition: Secondary | ICD-10-CM | POA: Diagnosis not present

## 2022-04-14 DIAGNOSIS — J449 Chronic obstructive pulmonary disease, unspecified: Secondary | ICD-10-CM | POA: Diagnosis not present

## 2022-04-14 DIAGNOSIS — E538 Deficiency of other specified B group vitamins: Secondary | ICD-10-CM | POA: Diagnosis not present

## 2022-04-14 DIAGNOSIS — E039 Hypothyroidism, unspecified: Secondary | ICD-10-CM | POA: Diagnosis not present

## 2022-04-15 DIAGNOSIS — J449 Chronic obstructive pulmonary disease, unspecified: Secondary | ICD-10-CM | POA: Diagnosis not present

## 2022-04-15 DIAGNOSIS — J9601 Acute respiratory failure with hypoxia: Secondary | ICD-10-CM | POA: Diagnosis not present

## 2022-04-15 DIAGNOSIS — E559 Vitamin D deficiency, unspecified: Secondary | ICD-10-CM | POA: Diagnosis not present

## 2022-04-15 DIAGNOSIS — E440 Moderate protein-calorie malnutrition: Secondary | ICD-10-CM | POA: Diagnosis not present

## 2022-04-15 DIAGNOSIS — E538 Deficiency of other specified B group vitamins: Secondary | ICD-10-CM | POA: Diagnosis not present

## 2022-04-15 DIAGNOSIS — E039 Hypothyroidism, unspecified: Secondary | ICD-10-CM | POA: Diagnosis not present

## 2022-04-21 DIAGNOSIS — E039 Hypothyroidism, unspecified: Secondary | ICD-10-CM | POA: Diagnosis not present

## 2022-04-21 DIAGNOSIS — J449 Chronic obstructive pulmonary disease, unspecified: Secondary | ICD-10-CM | POA: Diagnosis not present

## 2022-04-21 DIAGNOSIS — J9601 Acute respiratory failure with hypoxia: Secondary | ICD-10-CM | POA: Diagnosis not present

## 2022-04-21 DIAGNOSIS — E440 Moderate protein-calorie malnutrition: Secondary | ICD-10-CM | POA: Diagnosis not present

## 2022-04-21 DIAGNOSIS — E538 Deficiency of other specified B group vitamins: Secondary | ICD-10-CM | POA: Diagnosis not present

## 2022-04-21 DIAGNOSIS — E559 Vitamin D deficiency, unspecified: Secondary | ICD-10-CM | POA: Diagnosis not present

## 2022-04-22 ENCOUNTER — Encounter: Payer: Self-pay | Admitting: Radiology

## 2022-04-23 DIAGNOSIS — J9601 Acute respiratory failure with hypoxia: Secondary | ICD-10-CM | POA: Diagnosis not present

## 2022-04-23 DIAGNOSIS — E440 Moderate protein-calorie malnutrition: Secondary | ICD-10-CM | POA: Diagnosis not present

## 2022-04-23 DIAGNOSIS — E559 Vitamin D deficiency, unspecified: Secondary | ICD-10-CM | POA: Diagnosis not present

## 2022-04-23 DIAGNOSIS — F410 Panic disorder [episodic paroxysmal anxiety] without agoraphobia: Secondary | ICD-10-CM | POA: Diagnosis not present

## 2022-04-23 DIAGNOSIS — R531 Weakness: Secondary | ICD-10-CM | POA: Diagnosis not present

## 2022-04-23 DIAGNOSIS — E039 Hypothyroidism, unspecified: Secondary | ICD-10-CM | POA: Diagnosis not present

## 2022-04-23 DIAGNOSIS — J449 Chronic obstructive pulmonary disease, unspecified: Secondary | ICD-10-CM | POA: Diagnosis not present

## 2022-04-23 DIAGNOSIS — Z8616 Personal history of COVID-19: Secondary | ICD-10-CM | POA: Diagnosis not present

## 2022-04-23 DIAGNOSIS — J90 Pleural effusion, not elsewhere classified: Secondary | ICD-10-CM | POA: Diagnosis not present

## 2022-04-23 DIAGNOSIS — R000 Tachycardia, unspecified: Secondary | ICD-10-CM | POA: Diagnosis not present

## 2022-04-23 DIAGNOSIS — E538 Deficiency of other specified B group vitamins: Secondary | ICD-10-CM | POA: Diagnosis not present

## 2022-04-23 DIAGNOSIS — R42 Dizziness and giddiness: Secondary | ICD-10-CM | POA: Diagnosis not present

## 2022-04-28 DIAGNOSIS — E559 Vitamin D deficiency, unspecified: Secondary | ICD-10-CM | POA: Diagnosis not present

## 2022-04-28 DIAGNOSIS — J449 Chronic obstructive pulmonary disease, unspecified: Secondary | ICD-10-CM | POA: Diagnosis not present

## 2022-04-28 DIAGNOSIS — E440 Moderate protein-calorie malnutrition: Secondary | ICD-10-CM | POA: Diagnosis not present

## 2022-04-28 DIAGNOSIS — E039 Hypothyroidism, unspecified: Secondary | ICD-10-CM | POA: Diagnosis not present

## 2022-04-28 DIAGNOSIS — J9601 Acute respiratory failure with hypoxia: Secondary | ICD-10-CM | POA: Diagnosis not present

## 2022-04-28 DIAGNOSIS — E538 Deficiency of other specified B group vitamins: Secondary | ICD-10-CM | POA: Diagnosis not present

## 2022-05-05 DIAGNOSIS — J9601 Acute respiratory failure with hypoxia: Secondary | ICD-10-CM | POA: Diagnosis not present

## 2022-05-05 DIAGNOSIS — J449 Chronic obstructive pulmonary disease, unspecified: Secondary | ICD-10-CM | POA: Diagnosis not present

## 2022-05-05 DIAGNOSIS — E538 Deficiency of other specified B group vitamins: Secondary | ICD-10-CM | POA: Diagnosis not present

## 2022-05-05 DIAGNOSIS — E039 Hypothyroidism, unspecified: Secondary | ICD-10-CM | POA: Diagnosis not present

## 2022-05-05 DIAGNOSIS — E559 Vitamin D deficiency, unspecified: Secondary | ICD-10-CM | POA: Diagnosis not present

## 2022-05-05 DIAGNOSIS — E440 Moderate protein-calorie malnutrition: Secondary | ICD-10-CM | POA: Diagnosis not present

## 2022-05-12 DIAGNOSIS — J449 Chronic obstructive pulmonary disease, unspecified: Secondary | ICD-10-CM | POA: Diagnosis not present

## 2022-05-12 DIAGNOSIS — E440 Moderate protein-calorie malnutrition: Secondary | ICD-10-CM | POA: Diagnosis not present

## 2022-05-12 DIAGNOSIS — E538 Deficiency of other specified B group vitamins: Secondary | ICD-10-CM | POA: Diagnosis not present

## 2022-05-12 DIAGNOSIS — E039 Hypothyroidism, unspecified: Secondary | ICD-10-CM | POA: Diagnosis not present

## 2022-05-12 DIAGNOSIS — E559 Vitamin D deficiency, unspecified: Secondary | ICD-10-CM | POA: Diagnosis not present

## 2022-05-12 DIAGNOSIS — J9601 Acute respiratory failure with hypoxia: Secondary | ICD-10-CM | POA: Diagnosis not present

## 2022-05-13 DIAGNOSIS — J449 Chronic obstructive pulmonary disease, unspecified: Secondary | ICD-10-CM | POA: Diagnosis not present

## 2022-05-13 DIAGNOSIS — E039 Hypothyroidism, unspecified: Secondary | ICD-10-CM | POA: Diagnosis not present

## 2022-05-13 DIAGNOSIS — E538 Deficiency of other specified B group vitamins: Secondary | ICD-10-CM | POA: Diagnosis not present

## 2022-05-13 DIAGNOSIS — E559 Vitamin D deficiency, unspecified: Secondary | ICD-10-CM | POA: Diagnosis not present

## 2022-05-13 DIAGNOSIS — E440 Moderate protein-calorie malnutrition: Secondary | ICD-10-CM | POA: Diagnosis not present

## 2022-05-13 DIAGNOSIS — J9601 Acute respiratory failure with hypoxia: Secondary | ICD-10-CM | POA: Diagnosis not present

## 2022-05-19 DIAGNOSIS — E039 Hypothyroidism, unspecified: Secondary | ICD-10-CM | POA: Diagnosis not present

## 2022-05-19 DIAGNOSIS — E559 Vitamin D deficiency, unspecified: Secondary | ICD-10-CM | POA: Diagnosis not present

## 2022-05-19 DIAGNOSIS — J9601 Acute respiratory failure with hypoxia: Secondary | ICD-10-CM | POA: Diagnosis not present

## 2022-05-19 DIAGNOSIS — J449 Chronic obstructive pulmonary disease, unspecified: Secondary | ICD-10-CM | POA: Diagnosis not present

## 2022-05-19 DIAGNOSIS — E440 Moderate protein-calorie malnutrition: Secondary | ICD-10-CM | POA: Diagnosis not present

## 2022-05-19 DIAGNOSIS — E538 Deficiency of other specified B group vitamins: Secondary | ICD-10-CM | POA: Diagnosis not present

## 2022-05-24 DIAGNOSIS — E440 Moderate protein-calorie malnutrition: Secondary | ICD-10-CM | POA: Diagnosis not present

## 2022-05-24 DIAGNOSIS — R000 Tachycardia, unspecified: Secondary | ICD-10-CM | POA: Diagnosis not present

## 2022-05-24 DIAGNOSIS — Z8616 Personal history of COVID-19: Secondary | ICD-10-CM | POA: Diagnosis not present

## 2022-05-24 DIAGNOSIS — J449 Chronic obstructive pulmonary disease, unspecified: Secondary | ICD-10-CM | POA: Diagnosis not present

## 2022-05-24 DIAGNOSIS — F410 Panic disorder [episodic paroxysmal anxiety] without agoraphobia: Secondary | ICD-10-CM | POA: Diagnosis not present

## 2022-05-24 DIAGNOSIS — J90 Pleural effusion, not elsewhere classified: Secondary | ICD-10-CM | POA: Diagnosis not present

## 2022-05-24 DIAGNOSIS — R42 Dizziness and giddiness: Secondary | ICD-10-CM | POA: Diagnosis not present

## 2022-05-24 DIAGNOSIS — J9601 Acute respiratory failure with hypoxia: Secondary | ICD-10-CM | POA: Diagnosis not present

## 2022-05-24 DIAGNOSIS — E039 Hypothyroidism, unspecified: Secondary | ICD-10-CM | POA: Diagnosis not present

## 2022-05-24 DIAGNOSIS — E559 Vitamin D deficiency, unspecified: Secondary | ICD-10-CM | POA: Diagnosis not present

## 2022-05-24 DIAGNOSIS — R531 Weakness: Secondary | ICD-10-CM | POA: Diagnosis not present

## 2022-05-24 DIAGNOSIS — E538 Deficiency of other specified B group vitamins: Secondary | ICD-10-CM | POA: Diagnosis not present

## 2022-05-26 DIAGNOSIS — E538 Deficiency of other specified B group vitamins: Secondary | ICD-10-CM | POA: Diagnosis not present

## 2022-05-26 DIAGNOSIS — E440 Moderate protein-calorie malnutrition: Secondary | ICD-10-CM | POA: Diagnosis not present

## 2022-05-26 DIAGNOSIS — J449 Chronic obstructive pulmonary disease, unspecified: Secondary | ICD-10-CM | POA: Diagnosis not present

## 2022-05-26 DIAGNOSIS — J9601 Acute respiratory failure with hypoxia: Secondary | ICD-10-CM | POA: Diagnosis not present

## 2022-05-26 DIAGNOSIS — E559 Vitamin D deficiency, unspecified: Secondary | ICD-10-CM | POA: Diagnosis not present

## 2022-05-26 DIAGNOSIS — E039 Hypothyroidism, unspecified: Secondary | ICD-10-CM | POA: Diagnosis not present

## 2022-06-02 DIAGNOSIS — E440 Moderate protein-calorie malnutrition: Secondary | ICD-10-CM | POA: Diagnosis not present

## 2022-06-02 DIAGNOSIS — J449 Chronic obstructive pulmonary disease, unspecified: Secondary | ICD-10-CM | POA: Diagnosis not present

## 2022-06-02 DIAGNOSIS — E039 Hypothyroidism, unspecified: Secondary | ICD-10-CM | POA: Diagnosis not present

## 2022-06-02 DIAGNOSIS — E559 Vitamin D deficiency, unspecified: Secondary | ICD-10-CM | POA: Diagnosis not present

## 2022-06-02 DIAGNOSIS — J9601 Acute respiratory failure with hypoxia: Secondary | ICD-10-CM | POA: Diagnosis not present

## 2022-06-02 DIAGNOSIS — E538 Deficiency of other specified B group vitamins: Secondary | ICD-10-CM | POA: Diagnosis not present

## 2022-06-09 DIAGNOSIS — E538 Deficiency of other specified B group vitamins: Secondary | ICD-10-CM | POA: Diagnosis not present

## 2022-06-09 DIAGNOSIS — E039 Hypothyroidism, unspecified: Secondary | ICD-10-CM | POA: Diagnosis not present

## 2022-06-09 DIAGNOSIS — J449 Chronic obstructive pulmonary disease, unspecified: Secondary | ICD-10-CM | POA: Diagnosis not present

## 2022-06-09 DIAGNOSIS — E440 Moderate protein-calorie malnutrition: Secondary | ICD-10-CM | POA: Diagnosis not present

## 2022-06-09 DIAGNOSIS — E559 Vitamin D deficiency, unspecified: Secondary | ICD-10-CM | POA: Diagnosis not present

## 2022-06-09 DIAGNOSIS — J9601 Acute respiratory failure with hypoxia: Secondary | ICD-10-CM | POA: Diagnosis not present

## 2022-06-11 DIAGNOSIS — J9601 Acute respiratory failure with hypoxia: Secondary | ICD-10-CM | POA: Diagnosis not present

## 2022-06-11 DIAGNOSIS — E440 Moderate protein-calorie malnutrition: Secondary | ICD-10-CM | POA: Diagnosis not present

## 2022-06-11 DIAGNOSIS — J449 Chronic obstructive pulmonary disease, unspecified: Secondary | ICD-10-CM | POA: Diagnosis not present

## 2022-06-11 DIAGNOSIS — E039 Hypothyroidism, unspecified: Secondary | ICD-10-CM | POA: Diagnosis not present

## 2022-06-11 DIAGNOSIS — E559 Vitamin D deficiency, unspecified: Secondary | ICD-10-CM | POA: Diagnosis not present

## 2022-06-11 DIAGNOSIS — E538 Deficiency of other specified B group vitamins: Secondary | ICD-10-CM | POA: Diagnosis not present

## 2022-06-14 DIAGNOSIS — J9601 Acute respiratory failure with hypoxia: Secondary | ICD-10-CM | POA: Diagnosis not present

## 2022-06-14 DIAGNOSIS — E039 Hypothyroidism, unspecified: Secondary | ICD-10-CM | POA: Diagnosis not present

## 2022-06-14 DIAGNOSIS — J449 Chronic obstructive pulmonary disease, unspecified: Secondary | ICD-10-CM | POA: Diagnosis not present

## 2022-06-14 DIAGNOSIS — E440 Moderate protein-calorie malnutrition: Secondary | ICD-10-CM | POA: Diagnosis not present

## 2022-06-14 DIAGNOSIS — E559 Vitamin D deficiency, unspecified: Secondary | ICD-10-CM | POA: Diagnosis not present

## 2022-06-14 DIAGNOSIS — E538 Deficiency of other specified B group vitamins: Secondary | ICD-10-CM | POA: Diagnosis not present

## 2022-06-16 DIAGNOSIS — E039 Hypothyroidism, unspecified: Secondary | ICD-10-CM | POA: Diagnosis not present

## 2022-06-16 DIAGNOSIS — E538 Deficiency of other specified B group vitamins: Secondary | ICD-10-CM | POA: Diagnosis not present

## 2022-06-16 DIAGNOSIS — E559 Vitamin D deficiency, unspecified: Secondary | ICD-10-CM | POA: Diagnosis not present

## 2022-06-16 DIAGNOSIS — E440 Moderate protein-calorie malnutrition: Secondary | ICD-10-CM | POA: Diagnosis not present

## 2022-06-16 DIAGNOSIS — J9601 Acute respiratory failure with hypoxia: Secondary | ICD-10-CM | POA: Diagnosis not present

## 2022-06-16 DIAGNOSIS — J449 Chronic obstructive pulmonary disease, unspecified: Secondary | ICD-10-CM | POA: Diagnosis not present

## 2022-06-18 DIAGNOSIS — E559 Vitamin D deficiency, unspecified: Secondary | ICD-10-CM | POA: Diagnosis not present

## 2022-06-18 DIAGNOSIS — J9601 Acute respiratory failure with hypoxia: Secondary | ICD-10-CM | POA: Diagnosis not present

## 2022-06-18 DIAGNOSIS — E440 Moderate protein-calorie malnutrition: Secondary | ICD-10-CM | POA: Diagnosis not present

## 2022-06-18 DIAGNOSIS — E538 Deficiency of other specified B group vitamins: Secondary | ICD-10-CM | POA: Diagnosis not present

## 2022-06-18 DIAGNOSIS — J449 Chronic obstructive pulmonary disease, unspecified: Secondary | ICD-10-CM | POA: Diagnosis not present

## 2022-06-18 DIAGNOSIS — E039 Hypothyroidism, unspecified: Secondary | ICD-10-CM | POA: Diagnosis not present

## 2022-06-23 DIAGNOSIS — E559 Vitamin D deficiency, unspecified: Secondary | ICD-10-CM | POA: Diagnosis not present

## 2022-06-23 DIAGNOSIS — E039 Hypothyroidism, unspecified: Secondary | ICD-10-CM | POA: Diagnosis not present

## 2022-06-23 DIAGNOSIS — F410 Panic disorder [episodic paroxysmal anxiety] without agoraphobia: Secondary | ICD-10-CM | POA: Diagnosis not present

## 2022-06-23 DIAGNOSIS — R531 Weakness: Secondary | ICD-10-CM | POA: Diagnosis not present

## 2022-06-23 DIAGNOSIS — J449 Chronic obstructive pulmonary disease, unspecified: Secondary | ICD-10-CM | POA: Diagnosis not present

## 2022-06-23 DIAGNOSIS — J9601 Acute respiratory failure with hypoxia: Secondary | ICD-10-CM | POA: Diagnosis not present

## 2022-06-23 DIAGNOSIS — E538 Deficiency of other specified B group vitamins: Secondary | ICD-10-CM | POA: Diagnosis not present

## 2022-06-23 DIAGNOSIS — E440 Moderate protein-calorie malnutrition: Secondary | ICD-10-CM | POA: Diagnosis not present

## 2022-06-23 DIAGNOSIS — R42 Dizziness and giddiness: Secondary | ICD-10-CM | POA: Diagnosis not present

## 2022-06-23 DIAGNOSIS — J90 Pleural effusion, not elsewhere classified: Secondary | ICD-10-CM | POA: Diagnosis not present

## 2022-06-23 DIAGNOSIS — Z8616 Personal history of COVID-19: Secondary | ICD-10-CM | POA: Diagnosis not present

## 2022-06-23 DIAGNOSIS — R000 Tachycardia, unspecified: Secondary | ICD-10-CM | POA: Diagnosis not present

## 2022-06-25 DIAGNOSIS — E538 Deficiency of other specified B group vitamins: Secondary | ICD-10-CM | POA: Diagnosis not present

## 2022-06-25 DIAGNOSIS — J449 Chronic obstructive pulmonary disease, unspecified: Secondary | ICD-10-CM | POA: Diagnosis not present

## 2022-06-25 DIAGNOSIS — E440 Moderate protein-calorie malnutrition: Secondary | ICD-10-CM | POA: Diagnosis not present

## 2022-06-25 DIAGNOSIS — E039 Hypothyroidism, unspecified: Secondary | ICD-10-CM | POA: Diagnosis not present

## 2022-06-25 DIAGNOSIS — E559 Vitamin D deficiency, unspecified: Secondary | ICD-10-CM | POA: Diagnosis not present

## 2022-06-25 DIAGNOSIS — J9601 Acute respiratory failure with hypoxia: Secondary | ICD-10-CM | POA: Diagnosis not present

## 2022-06-30 DIAGNOSIS — E538 Deficiency of other specified B group vitamins: Secondary | ICD-10-CM | POA: Diagnosis not present

## 2022-06-30 DIAGNOSIS — J9601 Acute respiratory failure with hypoxia: Secondary | ICD-10-CM | POA: Diagnosis not present

## 2022-06-30 DIAGNOSIS — J449 Chronic obstructive pulmonary disease, unspecified: Secondary | ICD-10-CM | POA: Diagnosis not present

## 2022-06-30 DIAGNOSIS — E440 Moderate protein-calorie malnutrition: Secondary | ICD-10-CM | POA: Diagnosis not present

## 2022-06-30 DIAGNOSIS — E559 Vitamin D deficiency, unspecified: Secondary | ICD-10-CM | POA: Diagnosis not present

## 2022-06-30 DIAGNOSIS — E039 Hypothyroidism, unspecified: Secondary | ICD-10-CM | POA: Diagnosis not present

## 2022-07-02 DIAGNOSIS — E039 Hypothyroidism, unspecified: Secondary | ICD-10-CM | POA: Diagnosis not present

## 2022-07-02 DIAGNOSIS — E538 Deficiency of other specified B group vitamins: Secondary | ICD-10-CM | POA: Diagnosis not present

## 2022-07-02 DIAGNOSIS — E440 Moderate protein-calorie malnutrition: Secondary | ICD-10-CM | POA: Diagnosis not present

## 2022-07-02 DIAGNOSIS — E559 Vitamin D deficiency, unspecified: Secondary | ICD-10-CM | POA: Diagnosis not present

## 2022-07-02 DIAGNOSIS — J9601 Acute respiratory failure with hypoxia: Secondary | ICD-10-CM | POA: Diagnosis not present

## 2022-07-02 DIAGNOSIS — J449 Chronic obstructive pulmonary disease, unspecified: Secondary | ICD-10-CM | POA: Diagnosis not present

## 2022-07-07 DIAGNOSIS — E440 Moderate protein-calorie malnutrition: Secondary | ICD-10-CM | POA: Diagnosis not present

## 2022-07-07 DIAGNOSIS — E559 Vitamin D deficiency, unspecified: Secondary | ICD-10-CM | POA: Diagnosis not present

## 2022-07-07 DIAGNOSIS — J9601 Acute respiratory failure with hypoxia: Secondary | ICD-10-CM | POA: Diagnosis not present

## 2022-07-07 DIAGNOSIS — J449 Chronic obstructive pulmonary disease, unspecified: Secondary | ICD-10-CM | POA: Diagnosis not present

## 2022-07-07 DIAGNOSIS — E538 Deficiency of other specified B group vitamins: Secondary | ICD-10-CM | POA: Diagnosis not present

## 2022-07-07 DIAGNOSIS — E039 Hypothyroidism, unspecified: Secondary | ICD-10-CM | POA: Diagnosis not present

## 2022-07-09 DIAGNOSIS — E440 Moderate protein-calorie malnutrition: Secondary | ICD-10-CM | POA: Diagnosis not present

## 2022-07-09 DIAGNOSIS — E559 Vitamin D deficiency, unspecified: Secondary | ICD-10-CM | POA: Diagnosis not present

## 2022-07-09 DIAGNOSIS — E039 Hypothyroidism, unspecified: Secondary | ICD-10-CM | POA: Diagnosis not present

## 2022-07-09 DIAGNOSIS — J449 Chronic obstructive pulmonary disease, unspecified: Secondary | ICD-10-CM | POA: Diagnosis not present

## 2022-07-09 DIAGNOSIS — E538 Deficiency of other specified B group vitamins: Secondary | ICD-10-CM | POA: Diagnosis not present

## 2022-07-09 DIAGNOSIS — J9601 Acute respiratory failure with hypoxia: Secondary | ICD-10-CM | POA: Diagnosis not present

## 2022-07-14 DIAGNOSIS — J9601 Acute respiratory failure with hypoxia: Secondary | ICD-10-CM | POA: Diagnosis not present

## 2022-07-14 DIAGNOSIS — E039 Hypothyroidism, unspecified: Secondary | ICD-10-CM | POA: Diagnosis not present

## 2022-07-14 DIAGNOSIS — J449 Chronic obstructive pulmonary disease, unspecified: Secondary | ICD-10-CM | POA: Diagnosis not present

## 2022-07-14 DIAGNOSIS — E559 Vitamin D deficiency, unspecified: Secondary | ICD-10-CM | POA: Diagnosis not present

## 2022-07-14 DIAGNOSIS — E538 Deficiency of other specified B group vitamins: Secondary | ICD-10-CM | POA: Diagnosis not present

## 2022-07-14 DIAGNOSIS — E440 Moderate protein-calorie malnutrition: Secondary | ICD-10-CM | POA: Diagnosis not present

## 2022-07-16 DIAGNOSIS — E559 Vitamin D deficiency, unspecified: Secondary | ICD-10-CM | POA: Diagnosis not present

## 2022-07-16 DIAGNOSIS — J9601 Acute respiratory failure with hypoxia: Secondary | ICD-10-CM | POA: Diagnosis not present

## 2022-07-16 DIAGNOSIS — E039 Hypothyroidism, unspecified: Secondary | ICD-10-CM | POA: Diagnosis not present

## 2022-07-16 DIAGNOSIS — E538 Deficiency of other specified B group vitamins: Secondary | ICD-10-CM | POA: Diagnosis not present

## 2022-07-16 DIAGNOSIS — E440 Moderate protein-calorie malnutrition: Secondary | ICD-10-CM | POA: Diagnosis not present

## 2022-07-16 DIAGNOSIS — J449 Chronic obstructive pulmonary disease, unspecified: Secondary | ICD-10-CM | POA: Diagnosis not present

## 2022-07-20 DIAGNOSIS — E538 Deficiency of other specified B group vitamins: Secondary | ICD-10-CM | POA: Diagnosis not present

## 2022-07-20 DIAGNOSIS — J9601 Acute respiratory failure with hypoxia: Secondary | ICD-10-CM | POA: Diagnosis not present

## 2022-07-20 DIAGNOSIS — E440 Moderate protein-calorie malnutrition: Secondary | ICD-10-CM | POA: Diagnosis not present

## 2022-07-20 DIAGNOSIS — J449 Chronic obstructive pulmonary disease, unspecified: Secondary | ICD-10-CM | POA: Diagnosis not present

## 2022-07-20 DIAGNOSIS — E039 Hypothyroidism, unspecified: Secondary | ICD-10-CM | POA: Diagnosis not present

## 2022-07-20 DIAGNOSIS — E559 Vitamin D deficiency, unspecified: Secondary | ICD-10-CM | POA: Diagnosis not present

## 2022-07-21 DIAGNOSIS — E039 Hypothyroidism, unspecified: Secondary | ICD-10-CM | POA: Diagnosis not present

## 2022-07-21 DIAGNOSIS — E559 Vitamin D deficiency, unspecified: Secondary | ICD-10-CM | POA: Diagnosis not present

## 2022-07-21 DIAGNOSIS — J9601 Acute respiratory failure with hypoxia: Secondary | ICD-10-CM | POA: Diagnosis not present

## 2022-07-21 DIAGNOSIS — J449 Chronic obstructive pulmonary disease, unspecified: Secondary | ICD-10-CM | POA: Diagnosis not present

## 2022-07-21 DIAGNOSIS — E440 Moderate protein-calorie malnutrition: Secondary | ICD-10-CM | POA: Diagnosis not present

## 2022-07-21 DIAGNOSIS — E538 Deficiency of other specified B group vitamins: Secondary | ICD-10-CM | POA: Diagnosis not present

## 2022-07-24 DIAGNOSIS — F410 Panic disorder [episodic paroxysmal anxiety] without agoraphobia: Secondary | ICD-10-CM | POA: Diagnosis not present

## 2022-07-24 DIAGNOSIS — Z8616 Personal history of COVID-19: Secondary | ICD-10-CM | POA: Diagnosis not present

## 2022-07-24 DIAGNOSIS — R531 Weakness: Secondary | ICD-10-CM | POA: Diagnosis not present

## 2022-07-24 DIAGNOSIS — E538 Deficiency of other specified B group vitamins: Secondary | ICD-10-CM | POA: Diagnosis not present

## 2022-07-24 DIAGNOSIS — E440 Moderate protein-calorie malnutrition: Secondary | ICD-10-CM | POA: Diagnosis not present

## 2022-07-24 DIAGNOSIS — J90 Pleural effusion, not elsewhere classified: Secondary | ICD-10-CM | POA: Diagnosis not present

## 2022-07-24 DIAGNOSIS — R42 Dizziness and giddiness: Secondary | ICD-10-CM | POA: Diagnosis not present

## 2022-07-24 DIAGNOSIS — E559 Vitamin D deficiency, unspecified: Secondary | ICD-10-CM | POA: Diagnosis not present

## 2022-07-24 DIAGNOSIS — R000 Tachycardia, unspecified: Secondary | ICD-10-CM | POA: Diagnosis not present

## 2022-07-24 DIAGNOSIS — E039 Hypothyroidism, unspecified: Secondary | ICD-10-CM | POA: Diagnosis not present

## 2022-07-24 DIAGNOSIS — J9601 Acute respiratory failure with hypoxia: Secondary | ICD-10-CM | POA: Diagnosis not present

## 2022-07-24 DIAGNOSIS — J449 Chronic obstructive pulmonary disease, unspecified: Secondary | ICD-10-CM | POA: Diagnosis not present

## 2022-07-28 DIAGNOSIS — E559 Vitamin D deficiency, unspecified: Secondary | ICD-10-CM | POA: Diagnosis not present

## 2022-07-28 DIAGNOSIS — J449 Chronic obstructive pulmonary disease, unspecified: Secondary | ICD-10-CM | POA: Diagnosis not present

## 2022-07-28 DIAGNOSIS — E538 Deficiency of other specified B group vitamins: Secondary | ICD-10-CM | POA: Diagnosis not present

## 2022-07-28 DIAGNOSIS — J9601 Acute respiratory failure with hypoxia: Secondary | ICD-10-CM | POA: Diagnosis not present

## 2022-07-28 DIAGNOSIS — E039 Hypothyroidism, unspecified: Secondary | ICD-10-CM | POA: Diagnosis not present

## 2022-07-28 DIAGNOSIS — E440 Moderate protein-calorie malnutrition: Secondary | ICD-10-CM | POA: Diagnosis not present

## 2022-08-04 DIAGNOSIS — J449 Chronic obstructive pulmonary disease, unspecified: Secondary | ICD-10-CM | POA: Diagnosis not present

## 2022-08-04 DIAGNOSIS — E039 Hypothyroidism, unspecified: Secondary | ICD-10-CM | POA: Diagnosis not present

## 2022-08-04 DIAGNOSIS — J9601 Acute respiratory failure with hypoxia: Secondary | ICD-10-CM | POA: Diagnosis not present

## 2022-08-04 DIAGNOSIS — E440 Moderate protein-calorie malnutrition: Secondary | ICD-10-CM | POA: Diagnosis not present

## 2022-08-04 DIAGNOSIS — E538 Deficiency of other specified B group vitamins: Secondary | ICD-10-CM | POA: Diagnosis not present

## 2022-08-04 DIAGNOSIS — E559 Vitamin D deficiency, unspecified: Secondary | ICD-10-CM | POA: Diagnosis not present

## 2022-08-11 DIAGNOSIS — E559 Vitamin D deficiency, unspecified: Secondary | ICD-10-CM | POA: Diagnosis not present

## 2022-08-11 DIAGNOSIS — E039 Hypothyroidism, unspecified: Secondary | ICD-10-CM | POA: Diagnosis not present

## 2022-08-11 DIAGNOSIS — J449 Chronic obstructive pulmonary disease, unspecified: Secondary | ICD-10-CM | POA: Diagnosis not present

## 2022-08-11 DIAGNOSIS — J9601 Acute respiratory failure with hypoxia: Secondary | ICD-10-CM | POA: Diagnosis not present

## 2022-08-11 DIAGNOSIS — E440 Moderate protein-calorie malnutrition: Secondary | ICD-10-CM | POA: Diagnosis not present

## 2022-08-11 DIAGNOSIS — E538 Deficiency of other specified B group vitamins: Secondary | ICD-10-CM | POA: Diagnosis not present

## 2022-08-18 DIAGNOSIS — E559 Vitamin D deficiency, unspecified: Secondary | ICD-10-CM | POA: Diagnosis not present

## 2022-08-18 DIAGNOSIS — E538 Deficiency of other specified B group vitamins: Secondary | ICD-10-CM | POA: Diagnosis not present

## 2022-08-18 DIAGNOSIS — E039 Hypothyroidism, unspecified: Secondary | ICD-10-CM | POA: Diagnosis not present

## 2022-08-18 DIAGNOSIS — J449 Chronic obstructive pulmonary disease, unspecified: Secondary | ICD-10-CM | POA: Diagnosis not present

## 2022-08-18 DIAGNOSIS — J9601 Acute respiratory failure with hypoxia: Secondary | ICD-10-CM | POA: Diagnosis not present

## 2022-08-18 DIAGNOSIS — E440 Moderate protein-calorie malnutrition: Secondary | ICD-10-CM | POA: Diagnosis not present

## 2022-08-20 DIAGNOSIS — E440 Moderate protein-calorie malnutrition: Secondary | ICD-10-CM | POA: Diagnosis not present

## 2022-08-20 DIAGNOSIS — E039 Hypothyroidism, unspecified: Secondary | ICD-10-CM | POA: Diagnosis not present

## 2022-08-20 DIAGNOSIS — E538 Deficiency of other specified B group vitamins: Secondary | ICD-10-CM | POA: Diagnosis not present

## 2022-08-20 DIAGNOSIS — J9601 Acute respiratory failure with hypoxia: Secondary | ICD-10-CM | POA: Diagnosis not present

## 2022-08-20 DIAGNOSIS — E559 Vitamin D deficiency, unspecified: Secondary | ICD-10-CM | POA: Diagnosis not present

## 2022-08-20 DIAGNOSIS — J449 Chronic obstructive pulmonary disease, unspecified: Secondary | ICD-10-CM | POA: Diagnosis not present

## 2022-08-23 DIAGNOSIS — J449 Chronic obstructive pulmonary disease, unspecified: Secondary | ICD-10-CM | POA: Diagnosis not present

## 2022-08-23 DIAGNOSIS — E559 Vitamin D deficiency, unspecified: Secondary | ICD-10-CM | POA: Diagnosis not present

## 2022-08-23 DIAGNOSIS — J9601 Acute respiratory failure with hypoxia: Secondary | ICD-10-CM | POA: Diagnosis not present

## 2022-08-23 DIAGNOSIS — Z8616 Personal history of COVID-19: Secondary | ICD-10-CM | POA: Diagnosis not present

## 2022-08-23 DIAGNOSIS — R42 Dizziness and giddiness: Secondary | ICD-10-CM | POA: Diagnosis not present

## 2022-08-23 DIAGNOSIS — E039 Hypothyroidism, unspecified: Secondary | ICD-10-CM | POA: Diagnosis not present

## 2022-08-23 DIAGNOSIS — R000 Tachycardia, unspecified: Secondary | ICD-10-CM | POA: Diagnosis not present

## 2022-08-23 DIAGNOSIS — J90 Pleural effusion, not elsewhere classified: Secondary | ICD-10-CM | POA: Diagnosis not present

## 2022-08-23 DIAGNOSIS — F410 Panic disorder [episodic paroxysmal anxiety] without agoraphobia: Secondary | ICD-10-CM | POA: Diagnosis not present

## 2022-08-23 DIAGNOSIS — E440 Moderate protein-calorie malnutrition: Secondary | ICD-10-CM | POA: Diagnosis not present

## 2022-08-23 DIAGNOSIS — R531 Weakness: Secondary | ICD-10-CM | POA: Diagnosis not present

## 2022-08-23 DIAGNOSIS — E538 Deficiency of other specified B group vitamins: Secondary | ICD-10-CM | POA: Diagnosis not present

## 2022-08-25 DIAGNOSIS — E538 Deficiency of other specified B group vitamins: Secondary | ICD-10-CM | POA: Diagnosis not present

## 2022-08-25 DIAGNOSIS — E440 Moderate protein-calorie malnutrition: Secondary | ICD-10-CM | POA: Diagnosis not present

## 2022-08-25 DIAGNOSIS — E559 Vitamin D deficiency, unspecified: Secondary | ICD-10-CM | POA: Diagnosis not present

## 2022-08-25 DIAGNOSIS — J9601 Acute respiratory failure with hypoxia: Secondary | ICD-10-CM | POA: Diagnosis not present

## 2022-08-25 DIAGNOSIS — J449 Chronic obstructive pulmonary disease, unspecified: Secondary | ICD-10-CM | POA: Diagnosis not present

## 2022-08-25 DIAGNOSIS — E039 Hypothyroidism, unspecified: Secondary | ICD-10-CM | POA: Diagnosis not present

## 2022-09-01 DIAGNOSIS — J449 Chronic obstructive pulmonary disease, unspecified: Secondary | ICD-10-CM | POA: Diagnosis not present

## 2022-09-01 DIAGNOSIS — E538 Deficiency of other specified B group vitamins: Secondary | ICD-10-CM | POA: Diagnosis not present

## 2022-09-01 DIAGNOSIS — E559 Vitamin D deficiency, unspecified: Secondary | ICD-10-CM | POA: Diagnosis not present

## 2022-09-01 DIAGNOSIS — J9601 Acute respiratory failure with hypoxia: Secondary | ICD-10-CM | POA: Diagnosis not present

## 2022-09-01 DIAGNOSIS — E440 Moderate protein-calorie malnutrition: Secondary | ICD-10-CM | POA: Diagnosis not present

## 2022-09-01 DIAGNOSIS — E039 Hypothyroidism, unspecified: Secondary | ICD-10-CM | POA: Diagnosis not present

## 2022-09-08 DIAGNOSIS — J449 Chronic obstructive pulmonary disease, unspecified: Secondary | ICD-10-CM | POA: Diagnosis not present

## 2022-09-08 DIAGNOSIS — E559 Vitamin D deficiency, unspecified: Secondary | ICD-10-CM | POA: Diagnosis not present

## 2022-09-08 DIAGNOSIS — E440 Moderate protein-calorie malnutrition: Secondary | ICD-10-CM | POA: Diagnosis not present

## 2022-09-08 DIAGNOSIS — E538 Deficiency of other specified B group vitamins: Secondary | ICD-10-CM | POA: Diagnosis not present

## 2022-09-08 DIAGNOSIS — J9601 Acute respiratory failure with hypoxia: Secondary | ICD-10-CM | POA: Diagnosis not present

## 2022-09-08 DIAGNOSIS — E039 Hypothyroidism, unspecified: Secondary | ICD-10-CM | POA: Diagnosis not present

## 2022-09-10 DIAGNOSIS — E039 Hypothyroidism, unspecified: Secondary | ICD-10-CM | POA: Diagnosis not present

## 2022-09-10 DIAGNOSIS — E440 Moderate protein-calorie malnutrition: Secondary | ICD-10-CM | POA: Diagnosis not present

## 2022-09-10 DIAGNOSIS — E559 Vitamin D deficiency, unspecified: Secondary | ICD-10-CM | POA: Diagnosis not present

## 2022-09-10 DIAGNOSIS — J449 Chronic obstructive pulmonary disease, unspecified: Secondary | ICD-10-CM | POA: Diagnosis not present

## 2022-09-10 DIAGNOSIS — J9601 Acute respiratory failure with hypoxia: Secondary | ICD-10-CM | POA: Diagnosis not present

## 2022-09-10 DIAGNOSIS — E538 Deficiency of other specified B group vitamins: Secondary | ICD-10-CM | POA: Diagnosis not present

## 2022-09-13 DIAGNOSIS — E039 Hypothyroidism, unspecified: Secondary | ICD-10-CM | POA: Diagnosis not present

## 2022-09-13 DIAGNOSIS — E440 Moderate protein-calorie malnutrition: Secondary | ICD-10-CM | POA: Diagnosis not present

## 2022-09-13 DIAGNOSIS — J449 Chronic obstructive pulmonary disease, unspecified: Secondary | ICD-10-CM | POA: Diagnosis not present

## 2022-09-13 DIAGNOSIS — E538 Deficiency of other specified B group vitamins: Secondary | ICD-10-CM | POA: Diagnosis not present

## 2022-09-13 DIAGNOSIS — E559 Vitamin D deficiency, unspecified: Secondary | ICD-10-CM | POA: Diagnosis not present

## 2022-09-13 DIAGNOSIS — J9601 Acute respiratory failure with hypoxia: Secondary | ICD-10-CM | POA: Diagnosis not present

## 2022-09-15 DIAGNOSIS — J449 Chronic obstructive pulmonary disease, unspecified: Secondary | ICD-10-CM | POA: Diagnosis not present

## 2022-09-15 DIAGNOSIS — E039 Hypothyroidism, unspecified: Secondary | ICD-10-CM | POA: Diagnosis not present

## 2022-09-15 DIAGNOSIS — E440 Moderate protein-calorie malnutrition: Secondary | ICD-10-CM | POA: Diagnosis not present

## 2022-09-15 DIAGNOSIS — J9601 Acute respiratory failure with hypoxia: Secondary | ICD-10-CM | POA: Diagnosis not present

## 2022-09-15 DIAGNOSIS — E559 Vitamin D deficiency, unspecified: Secondary | ICD-10-CM | POA: Diagnosis not present

## 2022-09-15 DIAGNOSIS — E538 Deficiency of other specified B group vitamins: Secondary | ICD-10-CM | POA: Diagnosis not present

## 2022-09-16 DIAGNOSIS — J9601 Acute respiratory failure with hypoxia: Secondary | ICD-10-CM | POA: Diagnosis not present

## 2022-09-16 DIAGNOSIS — E559 Vitamin D deficiency, unspecified: Secondary | ICD-10-CM | POA: Diagnosis not present

## 2022-09-16 DIAGNOSIS — J449 Chronic obstructive pulmonary disease, unspecified: Secondary | ICD-10-CM | POA: Diagnosis not present

## 2022-09-16 DIAGNOSIS — E440 Moderate protein-calorie malnutrition: Secondary | ICD-10-CM | POA: Diagnosis not present

## 2022-09-16 DIAGNOSIS — E039 Hypothyroidism, unspecified: Secondary | ICD-10-CM | POA: Diagnosis not present

## 2022-09-16 DIAGNOSIS — E538 Deficiency of other specified B group vitamins: Secondary | ICD-10-CM | POA: Diagnosis not present

## 2022-09-17 DIAGNOSIS — E440 Moderate protein-calorie malnutrition: Secondary | ICD-10-CM | POA: Diagnosis not present

## 2022-09-17 DIAGNOSIS — J9601 Acute respiratory failure with hypoxia: Secondary | ICD-10-CM | POA: Diagnosis not present

## 2022-09-17 DIAGNOSIS — E538 Deficiency of other specified B group vitamins: Secondary | ICD-10-CM | POA: Diagnosis not present

## 2022-09-17 DIAGNOSIS — J449 Chronic obstructive pulmonary disease, unspecified: Secondary | ICD-10-CM | POA: Diagnosis not present

## 2022-09-17 DIAGNOSIS — E559 Vitamin D deficiency, unspecified: Secondary | ICD-10-CM | POA: Diagnosis not present

## 2022-09-17 DIAGNOSIS — E039 Hypothyroidism, unspecified: Secondary | ICD-10-CM | POA: Diagnosis not present

## 2022-09-18 DIAGNOSIS — E039 Hypothyroidism, unspecified: Secondary | ICD-10-CM | POA: Diagnosis not present

## 2022-09-18 DIAGNOSIS — E538 Deficiency of other specified B group vitamins: Secondary | ICD-10-CM | POA: Diagnosis not present

## 2022-09-18 DIAGNOSIS — J449 Chronic obstructive pulmonary disease, unspecified: Secondary | ICD-10-CM | POA: Diagnosis not present

## 2022-09-18 DIAGNOSIS — E559 Vitamin D deficiency, unspecified: Secondary | ICD-10-CM | POA: Diagnosis not present

## 2022-09-18 DIAGNOSIS — J9601 Acute respiratory failure with hypoxia: Secondary | ICD-10-CM | POA: Diagnosis not present

## 2022-09-18 DIAGNOSIS — E440 Moderate protein-calorie malnutrition: Secondary | ICD-10-CM | POA: Diagnosis not present

## 2022-09-23 DEATH — deceased
